# Patient Record
Sex: Female | Born: 1990 | Hispanic: No | Marital: Married | Smoking: Never smoker
Health system: Southern US, Community
[De-identification: ages and names within clinical notes are randomized; demographics above are authoritative.]

## PROBLEM LIST (undated history)

## (undated) ENCOUNTER — Inpatient Hospital Stay (HOSPITAL_COMMUNITY): Payer: Self-pay

## (undated) DIAGNOSIS — O219 Vomiting of pregnancy, unspecified: Secondary | ICD-10-CM

## (undated) DIAGNOSIS — K117 Disturbances of salivary secretion: Secondary | ICD-10-CM

## (undated) DIAGNOSIS — R000 Tachycardia, unspecified: Secondary | ICD-10-CM

## (undated) DIAGNOSIS — O99019 Anemia complicating pregnancy, unspecified trimester: Secondary | ICD-10-CM

## (undated) DIAGNOSIS — E876 Hypokalemia: Secondary | ICD-10-CM

## (undated) DIAGNOSIS — E0590 Thyrotoxicosis, unspecified without thyrotoxic crisis or storm: Secondary | ICD-10-CM

## (undated) DIAGNOSIS — D696 Thrombocytopenia, unspecified: Secondary | ICD-10-CM

## (undated) DIAGNOSIS — N90810 Female genital mutilation status, unspecified: Secondary | ICD-10-CM

## (undated) DIAGNOSIS — N939 Abnormal uterine and vaginal bleeding, unspecified: Secondary | ICD-10-CM

## (undated) DIAGNOSIS — K219 Gastro-esophageal reflux disease without esophagitis: Secondary | ICD-10-CM

## (undated) DIAGNOSIS — Z789 Other specified health status: Secondary | ICD-10-CM

## (undated) DIAGNOSIS — D649 Anemia, unspecified: Secondary | ICD-10-CM

## (undated) DIAGNOSIS — Z98891 History of uterine scar from previous surgery: Secondary | ICD-10-CM

---
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
## 2017-01-11 DIAGNOSIS — Z8639 Personal history of other endocrine, nutritional and metabolic disease: Secondary | ICD-10-CM

## 2017-01-11 HISTORY — DX: Personal history of other endocrine, nutritional and metabolic disease: Z86.39

## 2017-09-22 ENCOUNTER — Encounter (HOSPITAL_COMMUNITY): Payer: Self-pay

## 2017-09-22 ENCOUNTER — Emergency Department (HOSPITAL_COMMUNITY)
Admission: EM | Admit: 2017-09-22 | Discharge: 2017-09-22 | Disposition: A | Discharge status: Home / Self Care | Payer: Medicaid Other | Attending: Emergency Medicine | Admitting: Emergency Medicine

## 2017-09-22 ENCOUNTER — Other Ambulatory Visit: Payer: Self-pay

## 2017-09-22 DIAGNOSIS — O219 Vomiting of pregnancy, unspecified: Secondary | ICD-10-CM | POA: Diagnosis present

## 2017-09-22 DIAGNOSIS — R1013 Epigastric pain: Secondary | ICD-10-CM

## 2017-09-22 DIAGNOSIS — Z3201 Encounter for pregnancy test, result positive: Secondary | ICD-10-CM

## 2017-09-22 DIAGNOSIS — Z3A00 Weeks of gestation of pregnancy not specified: Secondary | ICD-10-CM | POA: Diagnosis not present

## 2017-09-22 LAB — COMPREHENSIVE METABOLIC PANEL
ALT: 14 U/L (ref 0–44)
AST: 22 U/L (ref 15–41)
Albumin: 4.5 g/dL (ref 3.5–5.0)
Alkaline Phosphatase: 55 U/L (ref 38–126)
Anion gap: 15 (ref 5–15)
BUN: 14 mg/dL (ref 6–20)
CHLORIDE: 106 mmol/L (ref 98–111)
CO2: 18 mmol/L — AB (ref 22–32)
CREATININE: 0.66 mg/dL (ref 0.44–1.00)
Calcium: 9.7 mg/dL (ref 8.9–10.3)
GFR calc non Af Amer: >60 mL/min (ref >60)
Glucose, Bld: 75 mg/dL (ref 70–99)
POTASSIUM: 3.5 mmol/L (ref 3.5–5.1)
Sodium: 139 mmol/L (ref 135–145)
Total Bilirubin: 1.3 mg/dL — ABNORMAL HIGH (ref 0.3–1.2)
Total Protein: 8.4 g/dL — ABNORMAL HIGH (ref 6.5–8.1)

## 2017-09-22 LAB — URINALYSIS, ROUTINE W REFLEX MICROSCOPIC
BACTERIA UA: NONE SEEN
BILIRUBIN URINE: NEGATIVE
Glucose, UA: NEGATIVE mg/dL
KETONES UR: 80 mg/dL — AB
LEUKOCYTES UA: NEGATIVE
Nitrite: NEGATIVE
PROTEIN: 30 mg/dL — AB
Specific Gravity, Urine: 1.033 — ABNORMAL HIGH (ref 1.005–1.030)
pH: 5 (ref 5.0–8.0)

## 2017-09-22 LAB — CBC
HEMATOCRIT: 42 % (ref 36.0–46.0)
Hemoglobin: 13.6 g/dL (ref 12.0–15.0)
MCH: 28.5 pg (ref 26.0–34.0)
MCHC: 32.4 g/dL (ref 30.0–36.0)
MCV: 88.1 fL (ref 78.0–100.0)
PLATELETS: 232 10*3/uL (ref 150–400)
RBC: 4.77 MIL/uL (ref 3.87–5.11)
RDW: 12.7 % (ref 11.5–15.5)
WBC: 10.3 10*3/uL (ref 4.0–10.5)

## 2017-09-22 LAB — I-STAT BETA HCG BLOOD, ED (MC, WL, AP ONLY)

## 2017-09-22 LAB — LIPASE, BLOOD: LIPASE: 25 U/L (ref 11–51)

## 2017-09-22 MED ORDER — ONDANSETRON HCL 4 MG/2ML IJ SOLN
4.0000 mg | Freq: Once | INTRAMUSCULAR | Status: AC
  Administered 2017-09-22: 4 mg via INTRAVENOUS
  Filled 2017-09-22: qty 2

## 2017-09-22 MED ORDER — CALCIUM CARBONATE ANTACID 500 MG PO CHEW
400.0000 mg | CHEWABLE_TABLET | Freq: Once | ORAL | Status: AC
  Administered 2017-09-22: 400 mg via ORAL
  Filled 2017-09-22: qty 2

## 2017-09-22 MED ORDER — SUCRALFATE 1 G PO TABS
1.0000 g | ORAL_TABLET | Freq: Once | ORAL | Status: AC
  Administered 2017-09-22: 1 g via ORAL
  Filled 2017-09-22: qty 1

## 2017-09-22 MED ORDER — SUCRALFATE 1 G PO TABS: 1.0000 g | ORAL_TABLET | Freq: Three times a day (TID) | ORAL | 0 refills | Status: DC

## 2017-09-22 MED ORDER — CALCIUM CARBONATE ANTACID 500 MG PO CHEW: 1.0000 | CHEWABLE_TABLET | Freq: Every day | ORAL | 0 refills | Status: DC

## 2017-09-22 MED ORDER — ONDANSETRON 4 MG PO TBDP: 4.0000 mg | ORAL_TABLET | Freq: Three times a day (TID) | ORAL | 0 refills | Status: DC | PRN

## 2017-09-22 MED ORDER — SODIUM CHLORIDE 0.9 % IV BOLUS
1000.0000 mL | Freq: Once | INTRAVENOUS | Status: AC
  Administered 2017-09-22: 1000 mL via INTRAVENOUS

## 2017-09-22 NOTE — ED Triage Notes (Signed)
Pt endorses abd pain with n/v x 1 week. Took a pregnancy test that was positive, came here to be sure that she is pregnant. VSS.

## 2017-09-22 NOTE — ED Provider Notes (Signed)
[facility]ERGENCY [facility] Provider Note   CSN: [id number] Arrival date & time: 09/22/17  1047     History   Chief Complaint Chief Complaint  Patient presents with  . Possible Pregnancy  . Vomiting  . Abdominal Pain    HPI Dorothy Haas F Feeback is a 1063P2 27 y.o. female presents today for evaluation of acute onset, progressively worsening feelings of indigestion, nausea, and vomiting for 1 week.  She notes intermittent epigastric pain which is burning, radiates up at times.  States this is consistent with indigestion she has experienced in the past.  She denies any shortness of breath or chest pain otherwise.  She notes that for the past 3 to 4 days she has not been able to keep down any food or drink.  Last menstrual period was on 08/01/2017.  Took a home pregnancy test recently that was positive.  Dates that she has had similar symptoms with her previous pregnancies.  Has not tried anything for her symptoms.  She denies fevers, chills, urinary symptoms, constipation, or diarrhea.  She denies vaginal itching, bleeding, discharge out of the ordinary.  He does feel lightheaded and will have occasional headaches which she states are typical for her when she feels dehydrated.  Denies syncope.  Primarily Arabic speaking, I was able to translate as it is my first language.  The history is provided by the patient and the spouse. The history is limited by a language barrier. Language interpreter used: Myself.    History reviewed. No pertinent past medical history.  There are no active problems to display for this patient.   History reviewed. No pertinent surgical history.   OB History   None      Home Medications    Prior to Admission medications   Medication Sig Start Date End Date Taking? Authorizing Provider  calcium carbonate (TUMS) 500 MG chewable tablet Chew 1 tablet (200 mg of elemental calcium total) by mouth daily. 09/22/17   Luevenia MaxinFawze, Emmett Bracknell A, PA-C    ondansetron (ZOFRAN ODT) 4 MG disintegrating tablet Take 1 tablet (4 mg total) by mouth every 8 (eight) hours as needed for nausea or vomiting. 09/22/17   Luevenia MaxinFawze, Kerim Statzer A, PA-C  sucralfate (CARAFATE) 1 g tablet Take 1 tablet (1 g total) by mouth 4 (four) times daily -  with meals and at bedtime. 09/22/17   Jeanie SewerFawze, Karlina Suares A, PA-C    Family History History reviewed. No pertinent family history.  Social History Social History   Tobacco Use  . Smoking status: Never Smoker  Substance Use Topics  . Alcohol use: Never    Frequency: Never  . Drug use: Never     Allergies   Patient has no known allergies.   Review of Systems Review of Systems  Constitutional: Positive for fatigue. Negative for chills and fever.  Respiratory: Negative for shortness of breath.   Cardiovascular: Negative for chest pain.  Gastrointestinal: Positive for abdominal pain, nausea and vomiting. Negative for constipation and diarrhea.  Genitourinary: Negative for decreased urine volume, vaginal bleeding, vaginal discharge and vaginal pain.  All other systems reviewed and are negative.    Physical Exam Updated Vital Signs BP 118/85 (BP Location: Right Arm)   Pulse 91   Temp 98.9 F (37.2 C) (Oral)   Resp 16   Ht 5\' 2"  (1.575 m)   Wt 50 kg   LMP 08/01/2017 (Exact Date)   SpO2 100%   BMI 20.16 kg/m   Physical Exam  Constitutional:  She appears well-developed and well-nourished. No distress.  HENT:  Head: Normocephalic and atraumatic.  Eyes: Conjunctivae are normal. Right eye exhibits no discharge. Left eye exhibits no discharge.  Neck: No JVD present. No tracheal deviation present.  Cardiovascular: Normal rate, regular rhythm and normal heart sounds.  Pulmonary/Chest: Effort normal and breath sounds normal.  Abdominal: Soft. Bowel sounds are normal. She exhibits no distension. There is tenderness in the epigastric area. There is no rigidity, no rebound, no guarding, no CVA tenderness, no tenderness at  McBurney's point and negative Murphy's sign.  Musculoskeletal: She exhibits no edema.  No midline spine TTP, no paraspinal muscle tenderness, no deformity, crepitus, or step-off noted   Neurological: She is alert.  Skin: Skin is warm and dry. No erythema.  Psychiatric: She has a normal mood and affect. Her behavior is normal.  Nursing note and vitals reviewed.    ED Treatments / Results  Labs (all labs ordered are listed, but only abnormal results are displayed) Labs Reviewed  COMPREHENSIVE METABOLIC PANEL - Abnormal; Notable for the following components:      Result Value   CO2 18 (*)    Total Protein 8.4 (*)    Total Bilirubin 1.3 (*)    All other components within normal limits  URINALYSIS, ROUTINE W REFLEX MICROSCOPIC - Abnormal; Notable for the following components:   APPearance HAZY (*)    Specific Gravity, Urine 1.033 (*)    Hgb urine dipstick SMALL (*)    Ketones, ur 80 (*)    Protein, ur 30 (*)    All other components within normal limits  I-STAT BETA HCG BLOOD, ED ([facility], [facility], [facility] ONLY) - Abnormal; Notable for the following components:   I-stat hCG, quantitative >2,000.0 (*)    All other components within normal limits  LIPASE, BLOOD  CBC    EKG None  Radiology No results found.  Procedures Procedures (including critical care time)  Medications Ordered in ED Medications  calcium carbonate (TUMS - dosed in mg elemental calcium) chewable tablet 400 mg of elemental calcium (has no administration in time range)  sodium chloride 0.9 % bolus 1,000 mL (1,000 mLs Intravenous New Bag/Given 09/22/17 1759)  ondansetron (ZOFRAN) injection 4 mg (4 mg Intravenous Given 09/22/17 1800)  sucralfate (CARAFATE) tablet 1 g (1 g Oral Given 09/22/17 1800)     Initial Impression / Assessment and Plan / ED Course  I have reviewed the triage vital signs and the nursing notes.  Pertinent labs & imaging results that were available during my care of the patient were reviewed by me and  considered in my medical decision making (see chart for details).     Patient presents with 3 to 4-day history of nausea and vomiting with intermittent epigastric abdominal pain.  She is afebrile, vital signs are stable.  She is nontoxic in appearance.  Abdomen is soft with no rebound or guarding.  Has had similar symptoms with previous pregnancies.  Pregnancy test today is positive.  No lower abdominal tenderness to suggest ectopic pregnancy, ovarian torsion, TOA, or PID.  Doubt obstruction, perforation, appendicitis, colitis.  Suspect hyperemesis gravidarum versus GERD in pregnancy.  Lab work reviewed by me shows no leukocytosis, no anemia.  No metabolic derangements.  Anion gap is within normal limits.  Lipase and creatinine within normal limits.  UA is not suggestive of UTI or nephrolithiasis but is not suggestive of dehydration with increased specific gravity and mild ketones and protein.  She was given IV fluids, Carafate, Tums,  and Zofran and on reevaluation states she is feeling much better.  She is tolerating p.o. fluids in the ED without difficulty.  Discussed lifestyle modifications.  Recommend follow-up with OB/GYN for reevaluation and for confirmatory ultrasound.  Discussed strict ED return precautions.  Patient and patient's husband verbalized understanding of and agreement with plan and patient is stable for discharge home at this time. Final Clinical Impressions(s) / ED Diagnoses   Final diagnoses:  Nausea and vomiting in pregnancy  Epigastric pain    ED Discharge Orders         Ordered    sucralfate (CARAFATE) 1 g tablet  3 times daily with meals & bedtime     09/22/17 1920    ondansetron (ZOFRAN ODT) 4 MG disintegrating tablet  Every 8 hours PRN     09/22/17 1920    calcium carbonate (TUMS) 500 MG chewable tablet  Daily     09/22/17 1920           Jeanie Sewer, PA-C 09/22/17 1931    Benjiman Core, MD 09/23/17 0101

## 2017-09-22 NOTE — Discharge Instructions (Addendum)
Your pregnancy test today was positive.  You can take Tums as needed 2-3 times daily.  Take Carafate 3 times daily with meals.  Take Zofran as needed for nausea up to 3 times daily.  Drink plenty of water and get plenty of rest.  Eat small frequent meals throughout the day.  Avoid spicy foods, fried foods, or fatty foods.  Elevate the head of the bed.  Follow-up with OB/GYN for reevaluation of symptoms.  Return to the emergency department if any concerning signs or symptoms develop such as fevers, vaginal bleeding, worsening abdominal pain, or persistent vomiting.

## 2017-09-22 NOTE — ED Notes (Signed)
Pt is here for evaluation of feeling fatigued and  Having indigestion.  LMP 7/22, positive home pregnancy test.  No abdominal pain. No acute distress.  Pt appears to be feeling tired.

## 2017-09-22 NOTE — ED Notes (Signed)
Pt states she is feeling well but declined to accept crackers or drink other than water.

## 2017-09-26 ENCOUNTER — Emergency Department (HOSPITAL_COMMUNITY)
Admission: EM | Admit: 2017-09-26 | Discharge: 2017-09-27 | Disposition: A | Discharge status: Home / Self Care | Payer: Medicaid Other | Attending: Emergency Medicine | Admitting: Emergency Medicine

## 2017-09-26 ENCOUNTER — Encounter (HOSPITAL_COMMUNITY): Payer: Self-pay

## 2017-09-26 ENCOUNTER — Other Ambulatory Visit: Payer: Self-pay

## 2017-09-26 DIAGNOSIS — K920 Hematemesis: Secondary | ICD-10-CM | POA: Diagnosis not present

## 2017-09-26 DIAGNOSIS — Z3A13 13 weeks gestation of pregnancy: Secondary | ICD-10-CM | POA: Diagnosis not present

## 2017-09-26 DIAGNOSIS — E876 Hypokalemia: Secondary | ICD-10-CM | POA: Insufficient documentation

## 2017-09-26 DIAGNOSIS — R102 Pelvic and perineal pain: Secondary | ICD-10-CM

## 2017-09-26 DIAGNOSIS — Z79899 Other long term (current) drug therapy: Secondary | ICD-10-CM | POA: Diagnosis not present

## 2017-09-26 DIAGNOSIS — O210 Mild hyperemesis gravidarum: Secondary | ICD-10-CM | POA: Insufficient documentation

## 2017-09-26 DIAGNOSIS — O219 Vomiting of pregnancy, unspecified: Secondary | ICD-10-CM | POA: Diagnosis present

## 2017-09-26 LAB — BASIC METABOLIC PANEL
ANION GAP: 17 — AB (ref 5–15)
BUN: 11 mg/dL (ref 6–20)
CHLORIDE: 100 mmol/L (ref 98–111)
CO2: 20 mmol/L — ABNORMAL LOW (ref 22–32)
Calcium: 9.6 mg/dL (ref 8.9–10.3)
Creatinine, Ser: 0.77 mg/dL (ref 0.44–1.00)
GFR calc Af Amer: >60 mL/min (ref >60)
GFR calc non Af Amer: >60 mL/min (ref >60)
Glucose, Bld: 95 mg/dL (ref 70–99)
POTASSIUM: 2.7 mmol/L — AB (ref 3.5–5.1)
Sodium: 137 mmol/L (ref 135–145)

## 2017-09-26 LAB — I-STAT TROPONIN, ED: Troponin i, poc: 0 ng/mL (ref 0.00–0.08)

## 2017-09-26 LAB — CBC
HEMATOCRIT: 41.8 % (ref 36.0–46.0)
HEMOGLOBIN: 14.3 g/dL (ref 12.0–15.0)
MCH: 28.8 pg (ref 26.0–34.0)
MCHC: 34.2 g/dL (ref 30.0–36.0)
MCV: 84.1 fL (ref 78.0–100.0)
Platelets: ABNORMAL 10*3/uL (ref 150–400)
RBC: 4.97 MIL/uL (ref 3.87–5.11)
RDW: 12.1 % (ref 11.5–15.5)
WBC: 10.5 10*3/uL (ref 4.0–10.5)

## 2017-09-26 NOTE — ED Triage Notes (Addendum)
Patient c/o vomiting blood. States unable to hold anything down. Also endorsed being [redacted] weeks pregnant. Also c/o CP

## 2017-09-26 NOTE — ED Notes (Signed)
Critical Potassium of 2.7

## 2017-09-27 ENCOUNTER — Emergency Department (HOSPITAL_COMMUNITY): Payer: Medicaid Other

## 2017-09-27 LAB — HCG, QUANTITATIVE, PREGNANCY: hCG, Beta Chain, Quant, S: 248645 m[IU]/mL — ABNORMAL HIGH (ref <5)

## 2017-09-27 MED ORDER — FAMOTIDINE 20 MG PO TABS: 20.0000 mg | ORAL_TABLET | Freq: Two times a day (BID) | ORAL | 0 refills | Status: DC

## 2017-09-27 MED ORDER — METOCLOPRAMIDE HCL 5 MG/ML IJ SOLN
10.0000 mg | Freq: Once | INTRAMUSCULAR | Status: AC
  Administered 2017-09-27: 10 mg via INTRAVENOUS
  Filled 2017-09-27: qty 2

## 2017-09-27 MED ORDER — VITAMIN B-6 25 MG PO TABS: 25.0000 mg | ORAL_TABLET | Freq: Three times a day (TID) | ORAL | 0 refills | Status: DC | PRN

## 2017-09-27 MED ORDER — FAMOTIDINE IN NACL 20-0.9 MG/50ML-% IV SOLN
20.0000 mg | Freq: Once | INTRAVENOUS | Status: AC
  Administered 2017-09-27: 20 mg via INTRAVENOUS
  Filled 2017-09-27: qty 50

## 2017-09-27 MED ORDER — MAGNESIUM SULFATE 2 GM/50ML IV SOLN
2.0000 g | Freq: Once | INTRAVENOUS | Status: AC
  Administered 2017-09-27: 2 g via INTRAVENOUS
  Filled 2017-09-27: qty 50

## 2017-09-27 MED ORDER — SODIUM CHLORIDE 0.9 % IV BOLUS (SEPSIS)
1000.0000 mL | Freq: Once | INTRAVENOUS | Status: AC
  Administered 2017-09-27: 1000 mL via INTRAVENOUS

## 2017-09-27 MED ORDER — DOXYLAMINE SUCCINATE (SLEEP) 25 MG PO TABS: 12.5000 mg | ORAL_TABLET | Freq: Every evening | ORAL | 0 refills | Status: DC | PRN

## 2017-09-27 MED ORDER — VITAMIN B-6 25 MG PO TABS: 25.0000 mg | ORAL_TABLET | Freq: Every day | ORAL | Status: DC

## 2017-09-27 MED ORDER — POTASSIUM CHLORIDE CRYS ER 20 MEQ PO TBCR
40.0000 meq | EXTENDED_RELEASE_TABLET | Freq: Once | ORAL | Status: AC
  Administered 2017-09-27: 40 meq via ORAL
  Filled 2017-09-27: qty 2

## 2017-09-27 MED ORDER — DOXYLAMINE SUCCINATE (SLEEP) 25 MG PO TABS: 25.0000 mg | ORAL_TABLET | Freq: Once | ORAL | Status: DC

## 2017-09-27 MED ORDER — DEXTROSE IN LACTATED RINGERS 5 % IV SOLN
INTRAVENOUS | Status: DC
  Administered 2017-09-27: 10:00:00 via INTRAVENOUS

## 2017-09-27 MED ORDER — DOXYLAMINE-PYRIDOXINE 10-10 MG PO TBEC: 2.0000 | DELAYED_RELEASE_TABLET | Freq: Every day | ORAL | 0 refills | Status: DC

## 2017-09-27 MED ORDER — POTASSIUM CHLORIDE CRYS ER 20 MEQ PO TBCR: 20.0000 meq | EXTENDED_RELEASE_TABLET | Freq: Once | ORAL | 0 refills | Status: DC

## 2017-09-27 MED ORDER — POTASSIUM CHLORIDE 10 MEQ/100ML IV SOLN
10.0000 meq | Freq: Once | INTRAVENOUS | Status: AC
  Administered 2017-09-27: 10 meq via INTRAVENOUS
  Filled 2017-09-27: qty 100

## 2017-09-27 NOTE — ED Notes (Signed)
Patient given water and saltine/graham crackers. Tolerating well at this time.

## 2017-09-27 NOTE — ED Provider Notes (Signed)
Patient presents with pregnancy and frequent vomiting.  Dr. Bebe ShaggyWickline initiated treatment.  Patient was found to be hypokalemic.  Plan was for rehydration, electrolyte substitution and ultrasound. Physical Exam  BP 111/74   Pulse 87   Temp 98.8 F (37.1 C) (Oral)   Resp 17   LMP 08/01/2017 (Exact Date)   SpO2 100%   Physical Exam  Constitutional: She is oriented to person, place, and time. She appears well-developed and well-nourished.  Patient is fatigued but alert and appropriate.  No respiratory distress.  Clinically well in appearance.  Eyes: EOM are normal.  Pulmonary/Chest: Effort normal.  Abdominal: Soft. She exhibits no distension and no mass. There is no tenderness. There is no guarding.  Musculoskeletal: Normal range of motion.  Neurological: She is alert and oriented to person, place, and time. Coordination normal.  Skin: Skin is warm and dry.  Psychiatric: She has a normal mood and affect.    ED Course/Procedures     Procedures  MDM  After initiation of treatment by Dr. Bebe ShaggyWickline, patient was feeling improvement.  Abdominal pain was subsiding.  She did not have further vomiting.  Ultrasound confirms IUP without apparent complications.  Patient is pain-free in the abdomen at this time and does not need further imaging.  We have transition to supplemental oral intake of potassium, doxylamine and pyridoxine.  I have also had the patient get IV Pepcid and lactated Ringer's with D5.  Prior to assuming care, patient had Reglan, 10 mEq potassium IV, magnesium sulfate and a liter of normal saline.  Patient continued to show signs of improvement and did not have ongoing vomiting.  With abdominal pain resolved, ultrasound within normal limits, will plan for continued home care with close follow-up.  Patient is being discharged with prescriptions for Pepcid to take twice daily for the next week, generic Diclygis and potassium supplement 20 mEq a day.  Return precautions are  reviewed.       Arby BarrettePfeiffer, Heinz Eckert, MD 09/27/17 1214

## 2017-09-27 NOTE — ED Notes (Signed)
Patient transported to Ultrasound 

## 2017-09-27 NOTE — ED Provider Notes (Signed)
[facility] EMERGENCY DEPARTMENT Provider Note   CSN: [id number] Arrival date & time: 09/26/17  2135     History   Chief Complaint Chief Complaint  Patient presents with  . Hematemesis    HPI Dorothy Haas is a 27 y.o. female.  The history is provided by the patient and the spouse. A language interpreter was used ([id number]).  Emesis   This is a recurrent problem. The current episode started more than 1 week ago. The problem has been gradually worsening. The emesis has an appearance of stomach contents. There has been no fever. Associated symptoms include abdominal pain. Pertinent negatives include no diarrhea.  Patient with known early pregnancy presents with vomiting.  She reports she has been vomiting for up to 2 weeks, and is worsening.  She now has some blood mixed in with the vomitus.  Denies any blood in her stool.  She also is reporting upper abdominal pain.  No fevers. Patient was seen on September 12 and was confirmed to have pregnancy, so noted to have vomiting at that time.  Since then she has had persistent vomiting and feels weak. Tried home medications without improvement Reports there has been some pain in her chest with vomiting  PMH-none OB History    Gravida  1   Para      Term      Preterm      AB      Living        SAB      TAB      Ectopic      Multiple      Live Births               Home Medications    Prior to Admission medications   Medication Sig Start Date End Date Taking? Authorizing Provider  calcium carbonate (TUMS) 500 MG chewable tablet Chew 1 tablet (200 mg of elemental calcium total) by mouth daily. 09/22/17   Luevenia Maxin, Mina A, PA-C  ondansetron (ZOFRAN ODT) 4 MG disintegrating tablet Take 1 tablet (4 mg total) by mouth every 8 (eight) hours as needed for nausea or vomiting. 09/22/17   Luevenia Maxin, Mina A, PA-C  sucralfate (CARAFATE) 1 g tablet Take 1 tablet (1 g total) by mouth 4 (four) times daily -   with meals and at bedtime. 09/22/17   Jeanie Sewer, PA-C    Family History No family history on file.  Social History Social History   Tobacco Use  . Smoking status: Never Smoker  Substance Use Topics  . Alcohol use: Never    Frequency: Never  . Drug use: Never     Allergies   Patient has no known allergies.   Review of Systems Review of Systems  Gastrointestinal: Positive for abdominal pain and vomiting. Negative for blood in stool and diarrhea.  Genitourinary: Negative for vaginal bleeding.  All other systems reviewed and are negative.    Physical Exam Updated Vital Signs BP 119/83   Pulse 88   Temp 98.8 F (37.1 C) (Oral)   Resp 16   LMP 08/01/2017 (Exact Date)   SpO2 100%   Physical Exam CONSTITUTIONAL: Well developed/well nourished, uncomfortable appearing HEAD: Normocephalic/atraumatic EYES: EOMI/PERRL, no icterus, conjunctival pink ENMT: Mucous membranes dry NECK: supple no meningeal signs SPINE/BACK:entire spine nontender CV: S1/S2 noted, no murmurs/rubs/gallops noted LUNGS: Lungs are clear to auscultation bilaterally, no apparent distress ABDOMEN: soft, nontender, no rebound or guarding, bowel sounds noted throughout abdomen GU:no cva  tenderness NEURO: Pt is awake/alert/appropriate, moves all extremitiesx4.  No facial droop.   EXTREMITIES: pulses normal/equal, full ROM SKIN: warm, color normal PSYCH: no abnormalities of mood noted, alert and oriented to situation   ED Treatments / Results  Labs (all labs ordered are listed, but only abnormal results are displayed) Labs Reviewed  BASIC METABOLIC PANEL - Abnormal; Notable for the following components:      Result Value   Potassium 2.7 (*)    CO2 20 (*)    Anion gap 17 (*)    All other components within normal limits  CBC  HCG, QUANTITATIVE, PREGNANCY  I-STAT TROPONIN, ED    EKG EKG Interpretation  Date/Time:  Monday September 26 2017 22:10:07 EDT Ventricular Rate:  100 PR  Interval:  130 QRS Duration: 74 QT Interval:  324 QTC Calculation: 417 R Axis:   65 Text Interpretation:  Normal sinus rhythm No previous ECGs available Interpretation limited secondary to artifact Confirmed by Zadie RhineWickline, Richardine Peppers ([id number]) on 09/27/2017 5:13:11 AM   Radiology No results found.  Procedures Procedures    Medications Ordered in ED Medications  potassium chloride 10 mEq in 100 mL IVPB (10 mEq Intravenous New Bag/Given 09/27/17 0624)  sodium chloride 0.9 % bolus 1,000 mL (0 mLs Intravenous Stopped 09/27/17 0652)  metoCLOPramide (REGLAN) injection 10 mg (10 mg Intravenous Given 09/27/17 [id number])  magnesium sulfate IVPB 2 g 50 mL (0 g Intravenous Stopped 09/27/17 [id number])     Initial Impression / Assessment and Plan / ED Course  I have reviewed the triage vital signs and the nursing notes.  Pertinent labs  results that were available during my care of the patient were reviewed by me and considered in my medical decision making (see chart for details).     7:08 AM Patient with early pregnancy presenting with probable hyperemesis gravidarum.  She did endorse some blood in her vomit, but suspect this is likely minimal or possible self limited Mallory-Weiss tear.  No signs of acute GI bleed, denies bloody stool, and hemoglobin is stable. She appears dehydrated, will require IV fluids.  She is also hypokalemic and  probable hypomagnesemia, IV K and magnesium ordered  Signed out to Dr. Clarice PolePfeifer with hCG quant pending.  Depending on quant level, patient may need pelvic ultrasound imaging.  I have went ahead and sent recommended nausea medications to her pharmacy. If pt is unable to tolerated PO she may need to be admitted  Final Clinical Impressions(s) / ED Diagnoses   Final diagnoses:  Hyperemesis gravidarum    ED Discharge Orders         Ordered    pyridOXINE (PYRIDOXINE) 25 MG tablet  Every 8 hours PRN     09/27/17 0706    doxylamine, Sleep, (UNISOM) 25 MG tablet  At bedtime PRN      09/27/17 0706           Zadie RhineWickline, Ulyses Panico, MD 09/27/17 [phone]

## 2017-09-27 NOTE — Discharge Instructions (Signed)
1.  Start taking Pepcid twice a day for the next 1 to 2 weeks.  Start doxylamine\Pyridoxine for nausea and vomiting as prescribed.  Start taking potassium supplement as prescribed.  Try to eat foods rich in potassium. 2.  Try to stay hydrated with frequent sips of fluids. 3.  Follow-up with your obstetrician within the next 1 to 3 days. 4.  Return immediately to the emergency department if you are feeling pain, you continue to have frequent vomiting, you feel generally weak or other concerning symptoms.

## 2017-09-27 NOTE — ED Notes (Signed)
Patient verbalizes understanding of discharge instructions. Opportunity for questioning and answers were provided. Armband removed by staff, pt discharged from ED.  

## 2017-10-07 ENCOUNTER — Emergency Department (HOSPITAL_COMMUNITY)
Admission: EM | Admit: 2017-10-07 | Discharge: 2017-10-07 | Disposition: A | Discharge status: Home / Self Care | Payer: Medicaid Other | Attending: Emergency Medicine | Admitting: Emergency Medicine

## 2017-10-07 ENCOUNTER — Encounter (HOSPITAL_COMMUNITY): Payer: Self-pay | Admitting: Emergency Medicine

## 2017-10-07 DIAGNOSIS — Z3A09 9 weeks gestation of pregnancy: Secondary | ICD-10-CM | POA: Insufficient documentation

## 2017-10-07 DIAGNOSIS — E860 Dehydration: Secondary | ICD-10-CM | POA: Diagnosis present

## 2017-10-07 DIAGNOSIS — O211 Hyperemesis gravidarum with metabolic disturbance: Secondary | ICD-10-CM | POA: Insufficient documentation

## 2017-10-07 DIAGNOSIS — O210 Mild hyperemesis gravidarum: Secondary | ICD-10-CM

## 2017-10-07 LAB — CBC WITH DIFFERENTIAL/PLATELET
Abs Immature Granulocytes: 0 10*3/uL (ref 0.0–0.1)
Basophils Absolute: 0 10*3/uL (ref 0.0–0.1)
Basophils Relative: 0 %
EOS ABS: 0 10*3/uL (ref 0.0–0.7)
EOS PCT: 0 %
HEMATOCRIT: 40.4 % (ref 36.0–46.0)
HEMOGLOBIN: 13.6 g/dL (ref 12.0–15.0)
Immature Granulocytes: 0 %
LYMPHS ABS: 1.6 10*3/uL (ref 0.7–4.0)
LYMPHS PCT: 21 %
MCH: 28.4 pg (ref 26.0–34.0)
MCHC: 33.7 g/dL (ref 30.0–36.0)
MCV: 84.3 fL (ref 78.0–100.0)
MONO ABS: 0.7 10*3/uL (ref 0.1–1.0)
MONOS PCT: 9 %
Neutro Abs: 5.3 10*3/uL (ref 1.7–7.7)
Neutrophils Relative %: 70 %
Platelets: 218 10*3/uL (ref 150–400)
RBC: 4.79 MIL/uL (ref 3.87–5.11)
RDW: 12.1 % (ref 11.5–15.5)
WBC: 7.7 10*3/uL (ref 4.0–10.5)

## 2017-10-07 LAB — URINALYSIS, ROUTINE W REFLEX MICROSCOPIC
Bilirubin Urine: NEGATIVE
GLUCOSE, UA: NEGATIVE mg/dL
HGB URINE DIPSTICK: NEGATIVE
KETONES UR: 80 mg/dL — AB
Leukocytes, UA: NEGATIVE
Nitrite: NEGATIVE
Protein, ur: 100 mg/dL — AB
Specific Gravity, Urine: 1.027 (ref 1.005–1.030)
pH: 5 (ref 5.0–8.0)

## 2017-10-07 LAB — BASIC METABOLIC PANEL
Anion gap: 18 — ABNORMAL HIGH (ref 5–15)
BUN: 10 mg/dL (ref 6–20)
CHLORIDE: 100 mmol/L (ref 98–111)
CO2: 18 mmol/L — AB (ref 22–32)
CREATININE: 0.7 mg/dL (ref 0.44–1.00)
Calcium: 9.8 mg/dL (ref 8.9–10.3)
GFR calc Af Amer: >60 mL/min (ref >60)
GFR calc non Af Amer: >60 mL/min (ref >60)
GLUCOSE: 120 mg/dL — AB (ref 70–99)
Potassium: 3.1 mmol/L — ABNORMAL LOW (ref 3.5–5.1)
Sodium: 136 mmol/L (ref 135–145)

## 2017-10-07 LAB — I-STAT CG4 LACTIC ACID, ED: LACTIC ACID, VENOUS: 0.89 mmol/L (ref 0.5–1.9)

## 2017-10-07 MED ORDER — POTASSIUM CHLORIDE 10 MEQ/100ML IV SOLN
10.0000 meq | Freq: Once | INTRAVENOUS | Status: AC
  Administered 2017-10-07: 10 meq via INTRAVENOUS
  Filled 2017-10-07: qty 100

## 2017-10-07 MED ORDER — METOCLOPRAMIDE HCL 5 MG/ML IJ SOLN
10.0000 mg | Freq: Once | INTRAMUSCULAR | Status: AC
  Administered 2017-10-07: 10 mg via INTRAVENOUS
  Filled 2017-10-07: qty 2

## 2017-10-07 MED ORDER — FAMOTIDINE IN NACL 20-0.9 MG/50ML-% IV SOLN
20.0000 mg | Freq: Once | INTRAVENOUS | Status: AC
  Administered 2017-10-07: 20 mg via INTRAVENOUS
  Filled 2017-10-07: qty 50

## 2017-10-07 MED ORDER — POTASSIUM CHLORIDE CRYS ER 20 MEQ PO TBCR
30.0000 meq | EXTENDED_RELEASE_TABLET | Freq: Once | ORAL | Status: AC
  Administered 2017-10-07: 30 meq via ORAL
  Filled 2017-10-07: qty 1

## 2017-10-07 MED ORDER — SODIUM CHLORIDE 0.9 % IV BOLUS
500.0000 mL | Freq: Once | INTRAVENOUS | Status: AC
  Administered 2017-10-07: 500 mL via INTRAVENOUS

## 2017-10-07 MED ORDER — METOCLOPRAMIDE HCL 10 MG PO TABS: 10.0000 mg | ORAL_TABLET | Freq: Every day | ORAL | 0 refills | Status: DC

## 2017-10-07 MED ORDER — SODIUM CHLORIDE 0.9 % IV BOLUS
1000.0000 mL | Freq: Once | INTRAVENOUS | Status: AC
  Administered 2017-10-07: 1000 mL via INTRAVENOUS

## 2017-10-07 MED ORDER — LACTATED RINGERS IV BOLUS
1000.0000 mL | Freq: Once | INTRAVENOUS | Status: AC
  Administered 2017-10-07: 1000 mL via INTRAVENOUS

## 2017-10-07 NOTE — ED Provider Notes (Signed)
[facility] EMERGENCY DEPARTMENT Provider Note   CSN: [id number] Arrival date & time: 10/07/17  0128     History   Chief Complaint Chief Complaint  Patient presents with  . Hyperemesis Gravidarum    HPI Dorothy Haas is a 27 y.o. female.  HPI   Dorothy Haas is a 27yo female who is [redacted] weeks pregnant and has a history of hyperemesis gravidarum who presents to the emergency department for evaluation of vomiting, dehydration and epigastric pain.  She reports that she has had greater than 10 episodes of vomiting daily for the last 3 weeks.  Has a history of this with prior pregnancies.  She occasionally tastes blood and sometimes has very dark vomiting, denies coffee-ground emesis.  She also reports epigastric pain which is 6/10 in severity, constant and acutely worsened with episodes of vomiting.  She has been taking diclegis and potassium daily for her symptoms without relief.  Per chart review she had an ultrasound in the ED 9/17 which revealed IUP.  Patient denies any lower abdominal pain, vaginal bleeding or vaginal discharge.  She denies any fever, diarrhea, dysuria, chest pain, shortness of breath, leg swelling.  Feels generally weak and reports that she has not had much to eat or drink in several days.  History reviewed. No pertinent past medical history.  There are no active problems to display for this patient.   History reviewed. No pertinent surgical history.   OB History    Gravida  1   Para      Term      Preterm      AB      Living        SAB      TAB      Ectopic      Multiple      Live Births               Home Medications    Prior to Admission medications   Medication Sig Start Date End Date Taking? Authorizing Provider  calcium carbonate (TUMS) 500 MG chewable tablet Chew 1 tablet (200 mg of elemental calcium total) by mouth daily. Patient not taking: Reported on 10/07/2017 09/22/17   Michela Pitcher A, PA-C    doxylamine, Sleep, (UNISOM) 25 MG tablet Take 0.5 tablets (12.5 mg total) by mouth at bedtime as needed (nausea). Patient not taking: Reported on 10/07/2017 09/27/17   Zadie Rhine, MD  Doxylamine-Pyridoxine 10-10 MG TBEC Take 2 tablets by mouth at bedtime. Take 2 tablets at bedtime for the first 2 days.  If you still have nausea or vomiting, start taking 1 tablet in the morning and continue 2 tablets in the evening.  If you still feel nausea, on day 4 start taking 1 tablet in the morning, 1 tablet in the afternoon and 2 tablets at bedtime. Patient not taking: Reported on 10/07/2017 09/27/17   Arby Barrette, MD  famotidine (PEPCID) 20 MG tablet Take 1 tablet (20 mg total) by mouth 2 (two) times daily. Patient not taking: Reported on 10/07/2017 09/27/17   Arby Barrette, MD  ondansetron (ZOFRAN ODT) 4 MG disintegrating tablet Take 1 tablet (4 mg total) by mouth every 8 (eight) hours as needed for nausea or vomiting. Patient not taking: Reported on 10/07/2017 09/22/17   Michela Pitcher A, PA-C  potassium chloride SA (K-DUR,KLOR-CON) 20 MEQ tablet Take 1 tablet (20 mEq total) by mouth once for 1 dose. 09/27/17 09/27/17  Arby Barrette, MD  pyridOXINE (PYRIDOXINE)  25 MG tablet Take 1 tablet (25 mg total) by mouth every 8 (eight) hours as needed. Patient not taking: Reported on 10/07/2017 09/27/17   Zadie Rhine, MD  sucralfate (CARAFATE) 1 g tablet Take 1 tablet (1 g total) by mouth 4 (four) times daily -  with meals and at bedtime. Patient not taking: Reported on 10/07/2017 09/22/17   Jeanie Sewer, PA-C    Family History No family history on file.  Social History Social History   Tobacco Use  . Smoking status: Never Smoker  . Smokeless tobacco: Never Used  Substance Use Topics  . Alcohol use: Never    Frequency: Never  . Drug use: Never     Allergies   Patient has no known allergies.   Review of Systems Review of Systems  Constitutional: Negative for chills and fever.  HENT: Negative  for sore throat.   Respiratory: Negative for shortness of breath.   Cardiovascular: Negative for chest pain and leg swelling.  Gastrointestinal: Positive for abdominal pain, nausea and vomiting. Negative for blood in stool and diarrhea.  Genitourinary: Negative for difficulty urinating.  Musculoskeletal: Negative for back pain.  Skin: Negative for color change.  Neurological: Positive for weakness (generalized). Negative for headaches.  Psychiatric/Behavioral: Negative for agitation.  All other systems reviewed and are negative.    Physical Exam Updated Vital Signs BP 109/66   Pulse 90   Temp 99.7 F (37.6 C) (Oral)   Resp 18   LMP 08/01/2017 (Exact Date)   SpO2 100%   Physical Exam  Constitutional: She is oriented to person, place, and time. She appears well-developed and well-nourished. No distress.  She is in no apparent distress, non-toxic appearing. Appears fatigued.    HENT:  Head: Normocephalic and atraumatic.  Mucous membranes appear dry.  Eyes: Conjunctivae are normal. Right eye exhibits no discharge. Left eye exhibits no discharge.  Neck: Normal range of motion. Neck supple.  Cardiovascular:  No murmur heard. Tachycardic, regular rhythm.  Pulmonary/Chest: Effort normal and breath sounds normal. No stridor. No respiratory distress. She has no wheezes. She has no rales.  Abdominal:  Abdomen soft and nondistended.  Bowel sounds hypoactive.  Abdomen nontender to palpation.  Musculoskeletal: Normal range of motion.  No leg swelling.  Neurological: She is alert and oriented to person, place, and time. Coordination normal.  Skin: Skin is warm and dry. She is not diaphoretic.  Psychiatric: She has a normal mood and affect. Her behavior is normal.  Nursing note and vitals reviewed.   ED Treatments / Results  Labs (all labs ordered are listed, but only abnormal results are displayed) Labs Reviewed  BASIC METABOLIC PANEL - Abnormal; Notable for the following  components:      Result Value   Potassium 3.1 (*)    CO2 18 (*)    Glucose, Bld 120 (*)    Anion gap 18 (*)    All other components within normal limits  URINALYSIS, ROUTINE W REFLEX MICROSCOPIC - Abnormal; Notable for the following components:   Color, Urine AMBER (*)    APPearance CLOUDY (*)    Ketones, ur 80 (*)    Protein, ur 100 (*)    Bacteria, UA MANY (*)    All other components within normal limits  CBC WITH DIFFERENTIAL/PLATELET  I-STAT CG4 LACTIC ACID, ED  I-STAT CG4 LACTIC ACID, ED    EKG None  Radiology No results found.  Procedures Procedures (including critical care time)  Medications Ordered in ED Medications  lactated  ringers bolus 1,000 mL (0 mLs Intravenous Stopped 10/07/17 0705)  potassium chloride 10 mEq in 100 mL IVPB (0 mEq Intravenous Stopped 10/07/17 0748)  metoCLOPramide (REGLAN) injection 10 mg (10 mg Intravenous Given 10/07/17 0634)  sodium chloride 0.9 % bolus 1,000 mL (0 mLs Intravenous Stopped 10/07/17 0830)  famotidine (PEPCID) IVPB 20 mg premix (0 mg Intravenous Stopped 10/07/17 0824)  potassium chloride (K-DUR,KLOR-CON) CR tablet 30 mEq (30 mEq Oral Given 10/07/17 1016)  sodium chloride 0.9 % bolus 500 mL (500 mLs Intravenous New Bag/Given 10/07/17 1016)     Initial Impression / Assessment and Plan / ED Course  I have reviewed the triage vital signs and the nursing notes.  Pertinent labs & imaging results that were available during my care of the patient were reviewed by me and considered in my medical decision making (see chart for details).    Presents with hyperemesis gravidarum with associated abdominal pain with vomiting.  This been ongoing for 3 weeks and she has had several ER visits for the same.  She had a confirmed IUP 10 days ago.  Abdominal exam benign, nontender to palpation.  She reports tasting blood with the vomit.  I doubt upper GI bleed given hemoglobin normal, vital signs stable and no bloody stools.  CBC without leukocytosis.   BMP reveals hypokalemia (potassium 3.1), this actually improved from prior.  She does have a anion gap acidosis, which is likely related to dehydration and poor p.o. intake with ketones in the urine.  Patient given several fluid boluses, potassium, Reglan and Pepcid.  On recheck she reports that she feels much improved and is tolerating ginger ale at the bedside.  Her urine appears contaminated with many squamous cells, many bacteria and few white blood cells.  I doubt urinary tract infection, but have counseled patient to have her urine rechecked when she sees her obstetrician in three days.  Plan to discharge patient. She has diclegis and potassium at home.  Will also add Reglan once daily, have counseled her to let her obstetrician know about this.  I have also counseled her on return precautions.  She agrees and appears reliable.  Final Clinical Impressions(s) / ED Diagnoses   Final diagnoses:  Hyperemesis gravidarum    ED Discharge Orders         Ordered    metoCLOPramide (REGLAN) 10 MG tablet  Daily     10/07/17 1101           Kellie Shropshire, [place] 10/07/17 1107    Geoffery Lyons, MD 10/10/17 [phone]

## 2017-10-07 NOTE — Discharge Instructions (Addendum)
Please have your urine rechecked at your appointment with the women's doctor on Monday.  Continue taking your prescribed doxylamine and pyridoxine.  I have written you a prescription for Reglan to help with vomiting as well, please let your women's doctor know about this medicine when you see her on Monday.  Come back to the ER if you have any new or concerning symptoms like vomiting that will not stop, feeling lightheaded like you are going to pass out.

## 2017-10-07 NOTE — ED Notes (Signed)
Patient given discharge instructions and verbalized understanding.  Patient stable to discharge at this time.  Patient is alert and oriented to baseline.  No distressed noted at this time.  All belongings taken with the patient at discharge.   

## 2017-10-07 NOTE — ED Triage Notes (Signed)
Patient reports multiple/persistent emesis today with epigastric pain unrelieved by prescription medications , she is [redacted] weeks pregnant G3P2 , denies diarrhea / no fever.

## 2017-10-10 ENCOUNTER — Ambulatory Visit (INDEPENDENT_AMBULATORY_CARE_PROVIDER_SITE_OTHER): Payer: Medicaid Other | Admitting: Advanced Practice Midwife

## 2017-10-10 ENCOUNTER — Encounter: Payer: Self-pay | Admitting: Advanced Practice Midwife

## 2017-10-10 ENCOUNTER — Other Ambulatory Visit (HOSPITAL_COMMUNITY)
Admission: RE | Admit: 2017-10-10 | Discharge: 2017-10-10 | Disposition: A | Discharge status: Home / Self Care | Payer: Medicaid Other | Source: Ambulatory Visit | Attending: Advanced Practice Midwife | Admitting: Advanced Practice Midwife

## 2017-10-10 DIAGNOSIS — Z3A10 10 weeks gestation of pregnancy: Secondary | ICD-10-CM | POA: Insufficient documentation

## 2017-10-10 DIAGNOSIS — Z3401 Encounter for supervision of normal first pregnancy, first trimester: Secondary | ICD-10-CM | POA: Insufficient documentation

## 2017-10-10 DIAGNOSIS — N90810 Female genital mutilation status, unspecified: Secondary | ICD-10-CM | POA: Insufficient documentation

## 2017-10-10 DIAGNOSIS — Z3480 Encounter for supervision of other normal pregnancy, unspecified trimester: Secondary | ICD-10-CM | POA: Insufficient documentation

## 2017-10-10 DIAGNOSIS — Z98891 History of uterine scar from previous surgery: Secondary | ICD-10-CM | POA: Insufficient documentation

## 2017-10-10 DIAGNOSIS — O34219 Maternal care for unspecified type scar from previous cesarean delivery: Secondary | ICD-10-CM | POA: Insufficient documentation

## 2017-10-10 HISTORY — DX: Female genital mutilation status, unspecified: N90.810

## 2017-10-10 HISTORY — DX: History of uterine scar from previous surgery: Z98.891

## 2017-10-10 NOTE — Progress Notes (Signed)
Subjective:   Dorothy Haas is a 27 y.o. G1P0 at [redacted]w[redacted]d by LMP, early ultrasound being seen today for her first obstetrical visit.  Her obstetrical history is significant for previous c-section x 2. Patient does intend to breast feed. Pregnancy history fully reviewed.  Patient reports heartburn, nausea and vomiting. Patient has been to [facility] ED several times. She has been given zofran and reglan. She states that the Zofran is not helping, and has not started the reglan at this time.   HISTORY: OB History  Gravida Para Term Preterm AB Living  3 2 2  0 0 2  SAB TAB Ectopic Multiple Live Births  0 0 0 0 2    # Outcome Date GA Lbr Len/2nd Weight Sex Delivery Anes PTL Lv  3 Current           2 Term 01/20/13    M CS-Unspec Spinal  LIV  1 Term 05/10/09    F CS-Unspec Spinal  LIV     Complications: Cephalopelvic Disproportion    Last pap smear was done Never and was NA  History reviewed. No pertinent past medical history. Past Surgical History:  Procedure Laterality Date  . CESAREAN SECTION     No family history on file. Social History   Tobacco Use  . Smoking status: Never Smoker  . Smokeless tobacco: Never Used  Substance Use Topics  . Alcohol use: Never    Frequency: Never  . Drug use: Never   Allergies  Allergen Reactions  . Eggs Or Egg-Derived Products Nausea And Vomiting   Current Outpatient Medications on File Prior to Visit  Medication Sig Dispense Refill  . metoCLOPramide (REGLAN) 10 MG tablet Take 1 tablet (10 mg total) by mouth daily. 30 tablet 0  . calcium carbonate (TUMS) 500 MG chewable tablet Chew 1 tablet (200 mg of elemental calcium total) by mouth daily. (Patient not taking: Reported on 10/07/2017) 30 tablet 0  . doxylamine, Sleep, (UNISOM) 25 MG tablet Take 0.5 tablets (12.5 mg total) by mouth at bedtime as needed (nausea). (Patient not taking: Reported on 10/07/2017) 20 tablet 0  . Doxylamine-Pyridoxine 10-10 MG TBEC Take 2 tablets by mouth at  bedtime. Take 2 tablets at bedtime for the first 2 days.  If you still have nausea or vomiting, start taking 1 tablet in the morning and continue 2 tablets in the evening.  If you still feel nausea, on day 4 start taking 1 tablet in the morning, 1 tablet in the afternoon and 2 tablets at bedtime. (Patient not taking: Reported on 10/07/2017) 60 tablet 0  . famotidine (PEPCID) 20 MG tablet Take 1 tablet (20 mg total) by mouth 2 (two) times daily. (Patient not taking: Reported on 10/07/2017) 30 tablet 0  . ondansetron (ZOFRAN ODT) 4 MG disintegrating tablet Take 1 tablet (4 mg total) by mouth every 8 (eight) hours as needed for nausea or vomiting. (Patient not taking: Reported on 10/07/2017) 15 tablet 0  . potassium chloride SA (K-DUR,KLOR-CON) 20 MEQ tablet Take 1 tablet (20 mEq total) by mouth once for 1 dose. 3 tablet 0  . pyridOXINE (PYRIDOXINE) 25 MG tablet Take 1 tablet (25 mg total) by mouth every 8 (eight) hours as needed. (Patient not taking: Reported on 10/07/2017) 20 tablet 0  . sucralfate (CARAFATE) 1 g tablet Take 1 tablet (1 g total) by mouth 4 (four) times daily -  with meals and at bedtime. (Patient not taking: Reported on 10/07/2017) 30 tablet 0   No current  facility-administered medications on file prior to visit.     Review of Systems Pertinent items noted in HPI and remainder of comprehensive ROS otherwise negative.  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    Adnexa: normal adnexa and no mass, fullness, tenderness   Bony Pelvis: average  System: General: well-developed, well-nourished female in no acute distress   Breast:     Skin: normal coloration and turgor, no  rashes   Neurologic: oriented, normal, negative, normal mood   Extremities: normal strength, tone, and muscle mass, ROM of all joints is normal   HEENT PERRLA, extraocular movement intact and sclera clear, anicteric   Mouth/Teeth mucous membranes moist, pharynx normal without lesions and dental hygiene good   Neck supple and no masses   Cardiovascular: regular rate and rhythm   Respiratory:  no respiratory distress, normal breath sounds   Abdomen: soft, non-tender; bowel sounds normal; no masses,  no organomegaly    Assessment:   Pregnancy: G1P0 Patient Active Problem List   Diagnosis Date Noted  . Supervision of other normal pregnancy, antepartum 10/10/2017  . Previous cesarean section x 2 10/10/2017     Plan:  1. Supervision of other normal pregnancy, antepartum - Culture, OB Urine - [place] MFM OB COMP + 14 WK; Future - Obstetric Panel, Including HIV - Genetic Screening - HgB A1c - Cytology - PAP - Hemoglobinopathy Evaluation - Cystic Fibrosis Mutation 97 - SMN1 Copy Number Analysis   Initial labs drawn. Continue prenatal vitamins. Genetic Screening discussed, NIPS: requested. Ultrasound discussed; fetal anatomic survey: ordered. Problem list reviewed and updated. The nature of [place] [facility] with multiple MDs and other Advanced Practice Providers was explained to patient; also emphasized that residents, students are part of our team. Routine obstetric precautions reviewed. 50% of 45 min visit spent in counseling and coordination of care. Return in about 4 weeks (around 11/07/2017).

## 2017-10-10 NOTE — Patient Instructions (Signed)
Childbirth Education Options: [facility] Classes:  Childbirth education classes can help you get ready for a positive parenting experience. You can also meet other expectant parents and get free stuff for your baby. Each class runs for five weeks on the same night and costs $45 for the mother-to-be and her support person. Medicaid covers the cost if you are eligible. Call [phone] to register. [facility] Childbirth Education:  [phone] or [phone] or [email or web address]  Baby & Me Class: Discuss newborn & infant parenting and family adjustment issues with other new mothers in a relaxed environment. Each week brings a new speaker or baby-centered activity. We encourage new mothers to join us every Thursday at 11:00am. Babies birth until crawling. No registration or fee. Daddy Boot Camp: This course offers Dads-to-be the tools and knowledge needed to feel confident on their journey to becoming new fathers. Experienced dads, who have been trained as coaches, teach dads-to-be how to hold, comfort, diaper, swaddle and play with their infant while being able to support the new mom as well. A class for men taught by men. $25/dad Big Brother/Big Sister: Let your children share in the joy of a new brother or sister in this special class designed just for them. Class includes discussion about how families care for babies: swaddling, holding, diapering, safety as well as how they can be helpful in their new role. This class is designed for children ages 2 to 6, but any age is welcome. Please register each child individually. $5/child  Mom Talk: This mom-led group offers support and connection to mothers as they journey through the adjustments and struggles of that sometimes overwhelming first year after the birth of a child. Tuesdays at 10:00am and Thursdays at 6:00pm. Babies welcome. No registration or fee. Breastfeeding Support Group: This group is a mother-to-mother  support circle where moms have the opportunity to share their breastfeeding experiences. A Lactation Consultant is present for questions and concerns. Meets each Tuesday at 11:00am. No fee or registration. Breastfeeding Your Baby: Learn what to expect in the first days of breastfeeding your newborn.  This class will help you feel more confident with the skills needed to begin your breastfeeding experience. Many new mothers are concerned about breastfeeding after leaving the hospital. This class will also address the most common fears and challenges about breastfeeding during the first few weeks, months and beyond. (call for fee) Comfort Techniques and Tour: This 2 hour interactive class will provide you the opportunity to learn & practice hands-on techniques that can help relieve some of the discomfort of labor and encourage your baby to rotate toward the best position for birth. You and your partner will be able to try a variety of labor positions with birth balls and rebozos as well as practice breathing, relaxation, and visualization techniques. A tour of the [facility] is included with this class. $20 per registrant and support person Childbirth Class- Weekend Option: This class is a Weekend version of our Birth & Baby series. It is designed for parents who have a difficult time fitting several weeks of classes into their schedule. It covers the care of your newborn and the basics of labor and childbirth. It also includes a Maternity Care Center Tour of [facility] and lunch. The class is held two consecutive days: beginning on Friday evening from 6:30 - 8:30 p.m. and the next day, Saturday from 9 a.m. - 4 p.m. (call for fee) Waterbirth Class: Interested in a waterbirth?  This   informational class will help you discover whether waterbirth is the right fit for you. Education about waterbirth itself, supplies you would need and how to assemble your support team is what you can  expect from this class. Some obstetrical practices require this class in order to pursue a waterbirth. (Not all obstetrical practices offer waterbirth-check with your healthcare provider.) Register only the expectant mom, but you are encouraged to bring your partner to class! Required if planning waterbirth, no fee. Infant/Child CPR: Parents, grandparents, babysitters, and friends learn Cardio-Pulmonary Resuscitation skills for infants and children. You will also learn how to treat both conscious and unconscious choking in infants and children. This Family & Friends program does not offer certification. Register each participant individually to ensure that enough mannequins are available. (Call for fee) Grandparent Love: Expecting a grandbaby? This class is for you! Learn about the latest infant care and safety recommendations and ways to support your own child as he or she transitions into the parenting role. Taught by Registered Nurses who are childbirth instructors, but most importantly...they are grandmothers too! $10/person. Childbirth Class- Natural Childbirth: This series of 5 weekly classes is for expectant parents who want to learn and practice natural methods of coping with the process of labor and childbirth. Relaxation, breathing, massage, visualization, role of the partner, and helpful positioning are highlighted. Participants learn how to be confident in their body's ability to give birth. This class will empower and help parents make informed decisions about their own care. Includes discussion that will help new parents transition into the immediate postpartum period. Maternity Care Center Tour of [facility] is included. We suggest taking this class between 25-32 weeks, but it's only a recommendation. $75 per registrant and one support person or $30 Medicaid. Childbirth Class- 3 week Series: This option of 3 weekly classes helps you and your labor partner prepare for childbirth. Newborn  care, labor & birth, cesarean birth, pain management, and comfort techniques are discussed and a Maternity Care Center Tour of [facility] is included. The class meets at the same time, on the same day of the week for 3 consecutive weeks beginning with the starting date you choose. $60 for registrant and one support person.  Marvelous Multiples: Expecting twins, triplets, or more? This class covers the differences in labor, birth, parenting, and breastfeeding issues that face multiples' parents. NICU tour is included. Led by a Certified Childbirth Educator who is the mother of twins. No fee. Caring for Baby: This class is for expectant and adoptive parents who want to learn and practice the most up-to-date newborn care for their babies. Focus is on birth through the first six weeks of life. Topics include feeding, bathing, diapering, crying, umbilical cord care, circumcision care and safe sleep. Parents learn to recognize symptoms of illness and when to call the pediatrician. Register only the mom-to-be and your partner or support person can plan to come with you! $10 per registrant and support person Childbirth Class- online option: This online class offers you the freedom to complete a Birth and Baby series in the comfort of your own home. The flexibility of this option allows you to review sections at your own pace, at times convenient to you and your support people. It includes additional video information, animations, quizzes, and extended activities. Get organized with helpful eClass tools, checklists, and trackers. Once you register online for the class, you will receive an email within a few days to accept the invitation and begin the class when the time   is right for you. The content will be available to you for 60 days. $60 for 60 days of online access for you and your support people.  Local Doulas: [employer] Baby Doulas [email or web address] Tel:  [phone] [email or web address] [email or web address] [phone] [email or web address] [employer]  (also do waterbirth tub rental) [phone] [email or web address] [facility] [phone] [email or web address] [employer]  [phone] [email or web address] [place] [employer] ([employer]ADA) [email or web address].[email or web address].htm [place] [facility]  [email or web address] Considering Waterbirth? Guide for patients at [facility]  Why consider waterbirth?  . Gentle birth for babies . Less pain medicine used in labor . May allow for passive descent/less pushing . May reduce perineal tears  . More mobility and instinctive maternal position changes . Increased maternal relaxation . Reduced blood pressure in labor  Is waterbirth safe? What are the risks of infection, drowning or other complications?  . Infection: o Very low risk (3.7 % for tub vs 4.8% for bed) o 7 in 8000 waterbirths with documented infection o Poorly cleaned equipment most common cause o Slightly lower group B strep transmission rate  . Drowning o Maternal:  - Very low risk   - Related to seizures or fainting o Newborn:  - Very low risk. No evidence of increased risk of respiratory problems in multiple large studies - Physiological protection from breathing under water - Avoid underwater birth if there are any fetal complications - Once baby's head is out of the water, keep it out.  . Birth complication o Some reports of cord trauma, but risk decreased by bringing baby to surface gradually o No evidence of increased risk of shoulder dystocia. Mothers can usually change positions faster in water than in a bed, possibly aiding the maneuvers to free the shoulder.   You must attend a Waterbirth class at [facility]  3rd Wednesday of every month from 7-9pm  Free  Register by calling [phone] or online at [email or web address]  Bring us the certificate from the class to your prenatal appointment  Meet with a midwife at 36 weeks to see if you can still plan a waterbirth and to sign the consent.   Purchase or rent the following supplies:   Water Birth Pool (Birth Pool in a Box or [employer] for instance)  (Tubs start ~$125)  Single-use disposable tub liner designed for your brand of tub  New garden hose labeled "lead-free", "suitable for drinking water",  Electric drain pump to remove water (We recommend 792 gallon per hour or greater pump.)   Separate garden hose to remove the dirty water  Fish net  Bathing suit top (optional)  Long-handled mirror (optional)  Places to purchase or rent supplies  [email or web address] for tub purchases and supplies  [email or web address] for tub purchases and supplies  [employer] ([email or web address]) $275 for tub rental/set-up & take down/kit   [place] [employer] (http://www.padanc.org/MeetUs.htm) Information regarding doulas (labor support) who provide pool rentals  Our practice has a Birth Pool in a Box tub at the hospital that you may borrow on a first-come-first-served basis. It is your responsibility to to set up, clean and break down the tub. We cannot guarantee the availability of this tub in advance. You are responsible for bringing all accessories listed above. If you do not have all necessary supplies you cannot have a waterbirth.    Things that would prevent you from having a waterbirth:  Premature, <37wks  Previous cesarean birth  Presence of thick meconium-stained fluid  Multiple gestation (Twins,   triplets, etc.)  Uncontrolled diabetes or gestational diabetes requiring medication  Hypertension requiring medication or diagnosis of pre-eclampsia  Heavy vaginal bleeding  Non-reassuring fetal  heart rate  Active infection (MRSA, etc.). Group B Strep is NOT a contraindication for  waterbirth.  If your labor has to be induced and induction method requires continuous  monitoring of the baby's heart rate  Other risks/issues identified by your obstetrical provider  Please remember that birth is unpredictable. Under certain unforeseeable circumstances your provider may advise against giving birth in the tub. These decisions will be made on a case-by-case basis and with the safety of you and your baby as our highest priority.      

## 2017-10-11 LAB — CYTOLOGY - PAP
CHLAMYDIA, DNA PROBE: NEGATIVE
Diagnosis: NEGATIVE
Neisseria Gonorrhea: NEGATIVE

## 2017-10-12 ENCOUNTER — Other Ambulatory Visit: Payer: Self-pay

## 2017-10-12 ENCOUNTER — Encounter (HOSPITAL_COMMUNITY): Payer: Self-pay | Admitting: *Deleted

## 2017-10-12 ENCOUNTER — Inpatient Hospital Stay (HOSPITAL_COMMUNITY)
Admission: AD | Admit: 2017-10-12 | Discharge: 2017-10-14 | DRG: 833 | Disposition: A | Discharge status: Home / Self Care | Payer: Medicaid Other | Attending: Obstetrics and Gynecology | Admitting: Obstetrics and Gynecology

## 2017-10-12 DIAGNOSIS — O99280 Endocrine, nutritional and metabolic diseases complicating pregnancy, unspecified trimester: Secondary | ICD-10-CM

## 2017-10-12 DIAGNOSIS — O99282 Endocrine, nutritional and metabolic diseases complicating pregnancy, second trimester: Secondary | ICD-10-CM | POA: Diagnosis present

## 2017-10-12 DIAGNOSIS — R7401 Elevation of levels of liver transaminase levels: Secondary | ICD-10-CM

## 2017-10-12 DIAGNOSIS — E0590 Thyrotoxicosis, unspecified without thyrotoxic crisis or storm: Secondary | ICD-10-CM | POA: Clinically undetermined

## 2017-10-12 DIAGNOSIS — R748 Abnormal levels of other serum enzymes: Secondary | ICD-10-CM | POA: Diagnosis present

## 2017-10-12 DIAGNOSIS — O219 Vomiting of pregnancy, unspecified: Secondary | ICD-10-CM | POA: Diagnosis present

## 2017-10-12 DIAGNOSIS — O210 Mild hyperemesis gravidarum: Principal | ICD-10-CM | POA: Diagnosis present

## 2017-10-12 DIAGNOSIS — K117 Disturbances of salivary secretion: Secondary | ICD-10-CM

## 2017-10-12 DIAGNOSIS — R740 Nonspecific elevation of levels of transaminase and lactic acid dehydrogenase [LDH]: Secondary | ICD-10-CM | POA: Diagnosis present

## 2017-10-12 DIAGNOSIS — Z3A10 10 weeks gestation of pregnancy: Secondary | ICD-10-CM | POA: Diagnosis not present

## 2017-10-12 DIAGNOSIS — Z3480 Encounter for supervision of other normal pregnancy, unspecified trimester: Secondary | ICD-10-CM

## 2017-10-12 DIAGNOSIS — E876 Hypokalemia: Secondary | ICD-10-CM

## 2017-10-12 HISTORY — DX: Hypokalemia: E87.6

## 2017-10-12 HISTORY — DX: Elevation of levels of liver transaminase levels: R74.01

## 2017-10-12 HISTORY — DX: Hypomagnesemia: E83.42

## 2017-10-12 HISTORY — DX: Vomiting of pregnancy, unspecified: O21.9

## 2017-10-12 LAB — COMPREHENSIVE METABOLIC PANEL
ALBUMIN: 4.6 g/dL (ref 3.5–5.0)
ALT: 192 U/L — ABNORMAL HIGH (ref 0–44)
AST: 148 U/L — ABNORMAL HIGH (ref 15–41)
Alkaline Phosphatase: 56 U/L (ref 38–126)
Anion gap: 13 (ref 5–15)
BUN: 8 mg/dL (ref 6–20)
CHLORIDE: 105 mmol/L (ref 98–111)
CO2: 18 mmol/L — ABNORMAL LOW (ref 22–32)
Calcium: 9.9 mg/dL (ref 8.9–10.3)
Creatinine, Ser: 0.44 mg/dL (ref 0.44–1.00)
GFR calc Af Amer: >60 mL/min (ref >60)
GLUCOSE: 91 mg/dL (ref 70–99)
POTASSIUM: 3.2 mmol/L — AB (ref 3.5–5.1)
SODIUM: 136 mmol/L (ref 135–145)
Total Bilirubin: 1.8 mg/dL — ABNORMAL HIGH (ref 0.3–1.2)
Total Protein: 8.4 g/dL — ABNORMAL HIGH (ref 6.5–8.1)

## 2017-10-12 LAB — URINALYSIS, ROUTINE W REFLEX MICROSCOPIC
Bilirubin Urine: NEGATIVE
GLUCOSE, UA: NEGATIVE mg/dL
HGB URINE DIPSTICK: NEGATIVE
KETONES UR: 80 mg/dL — AB
Leukocytes, UA: NEGATIVE
Nitrite: NEGATIVE
PH: 6 (ref 5.0–8.0)
PROTEIN: 100 mg/dL — AB
SPECIFIC GRAVITY, URINE: 1.028 (ref 1.005–1.030)

## 2017-10-12 LAB — CBC WITH DIFFERENTIAL/PLATELET
BASOS ABS: 0 10*3/uL (ref 0.0–0.1)
BASOS PCT: 0 %
EOS PCT: 0 %
Eosinophils Absolute: 0 10*3/uL (ref 0.0–0.7)
HCT: 37.2 % (ref 36.0–46.0)
Hemoglobin: 13 g/dL (ref 12.0–15.0)
Lymphocytes Relative: 31 %
Lymphs Abs: 2.2 10*3/uL (ref 0.7–4.0)
MCH: 28.4 pg (ref 26.0–34.0)
MCHC: 34.9 g/dL (ref 30.0–36.0)
MCV: 81.4 fL (ref 78.0–100.0)
MONO ABS: 0.3 10*3/uL (ref 0.1–1.0)
Monocytes Relative: 4 %
Neutro Abs: 4.6 10*3/uL (ref 1.7–7.7)
Neutrophils Relative %: 65 %
PLATELETS: 197 10*3/uL (ref 150–400)
RBC: 4.57 MIL/uL (ref 3.87–5.11)
RDW: 12.5 % (ref 11.5–15.5)
WBC: 7.1 10*3/uL (ref 4.0–10.5)

## 2017-10-12 LAB — MAGNESIUM: MAGNESIUM: 1.6 mg/dL — AB (ref 1.7–2.4)

## 2017-10-12 LAB — T4, FREE: Free T4: 3.1 ng/dL — ABNORMAL HIGH (ref 0.82–1.77)

## 2017-10-12 LAB — LIPASE, BLOOD: LIPASE: 36 U/L (ref 11–51)

## 2017-10-12 LAB — AMYLASE: Amylase: 74 U/L (ref 28–100)

## 2017-10-12 LAB — TSH

## 2017-10-12 MED ORDER — FAMOTIDINE IN NACL 20-0.9 MG/50ML-% IV SOLN
20.0000 mg | Freq: Once | INTRAVENOUS | Status: AC
  Administered 2017-10-12: 20 mg via INTRAVENOUS
  Filled 2017-10-12: qty 50

## 2017-10-12 MED ORDER — FAMOTIDINE IN NACL 20-0.9 MG/50ML-% IV SOLN
20.0000 mg | Freq: Two times a day (BID) | INTRAVENOUS | Status: DC
  Filled 2017-10-12: qty 50

## 2017-10-12 MED ORDER — MAGNESIUM SULFATE 2 GM/50ML IV SOLN
2.0000 g | Freq: Once | INTRAVENOUS | Status: AC
  Administered 2017-10-12: 2 g via INTRAVENOUS
  Filled 2017-10-12 (×2): qty 50

## 2017-10-12 MED ORDER — PROMETHAZINE HCL 25 MG/ML IJ SOLN
25.0000 mg | Freq: Once | INTRAVENOUS | Status: AC
  Administered 2017-10-12: 25 mg via INTRAVENOUS
  Filled 2017-10-12: qty 1

## 2017-10-12 MED ORDER — PROMETHAZINE HCL 25 MG/ML IJ SOLN
25.0000 mg | Freq: Four times a day (QID) | INTRAMUSCULAR | Status: DC
  Administered 2017-10-12 – 2017-10-13 (×4): 25 mg via INTRAVENOUS
  Filled 2017-10-12 (×4): qty 1

## 2017-10-12 MED ORDER — ACETAMINOPHEN 325 MG PO TABS: 650.0000 mg | ORAL_TABLET | ORAL | Status: DC | PRN

## 2017-10-12 MED ORDER — POTASSIUM CHLORIDE IN NACL 40-0.9 MEQ/L-% IV SOLN
INTRAVENOUS | Status: DC
  Filled 2017-10-12 (×2): qty 1000

## 2017-10-12 MED ORDER — DIPHENHYDRAMINE HCL 25 MG PO CAPS
25.0000 mg | ORAL_CAPSULE | Freq: Two times a day (BID) | ORAL | Status: DC
  Administered 2017-10-12 – 2017-10-14 (×4): 25 mg via ORAL
  Filled 2017-10-12 (×4): qty 1

## 2017-10-12 MED ORDER — VITAMIN B-6 50 MG PO TABS
50.0000 mg | ORAL_TABLET | Freq: Every day | ORAL | Status: DC
  Administered 2017-10-13 – 2017-10-14 (×2): 50 mg via ORAL
  Filled 2017-10-12 (×4): qty 1

## 2017-10-12 MED ORDER — ZOLPIDEM TARTRATE 5 MG PO TABS: 5.0000 mg | ORAL_TABLET | Freq: Every evening | ORAL | Status: DC | PRN

## 2017-10-12 MED ORDER — SCOPOLAMINE 1 MG/3DAYS TD PT72
1.0000 | MEDICATED_PATCH | TRANSDERMAL | Status: DC
  Administered 2017-10-12: 1.5 mg via TRANSDERMAL
  Filled 2017-10-12: qty 1

## 2017-10-12 MED ORDER — SODIUM CHLORIDE 0.9 % IV SOLN
8.0000 mg | Freq: Once | INTRAVENOUS | Status: AC
  Administered 2017-10-12: 8 mg via INTRAVENOUS
  Filled 2017-10-12: qty 4

## 2017-10-12 MED ORDER — LACTATED RINGERS IV BOLUS
1000.0000 mL | Freq: Once | INTRAVENOUS | Status: AC
  Administered 2017-10-12: 1000 mL via INTRAVENOUS

## 2017-10-12 MED ORDER — SODIUM CHLORIDE 0.9 % IV SOLN
INTRAVENOUS | Status: DC
  Administered 2017-10-12 – 2017-10-14 (×6): via INTRAVENOUS
  Filled 2017-10-12 (×7): qty 1000

## 2017-10-12 NOTE — MAU Note (Signed)
Don't eat, poor appetite, when she does try to eat or drink she vomits, sometimes emesis is black/ or looks like there is blood.  abd pain.  Went to ER for this - they changed her medication and it did not help.

## 2017-10-12 NOTE — MAU Provider Note (Addendum)
History     CSN: [id number]  Arrival date and time: 10/12/17 1351   First Provider Initiated Contact with Patient 10/12/17 1442      Chief Complaint  Patient presents with  . Abdominal Pain  . Emesis  . no appetite   Dorothy Haas is a 27 y.o. G3P2002 at [redacted]w[redacted]d who presents today with nausea and vomiting. This is her 5th ED visit for the same complaint. She has tried all the medications that she has been given, but none of them help.  Emesis   This is a new problem. The current episode started more than 1 month ago. The problem occurs more than 10 times per day. The problem has been unchanged. The emesis has an appearance of stomach contents. There has been no fever. Risk factors: pregnancy  Treatments tried: zofran, reglan and pepcid  The treatment provided no relief.    OB History    Gravida  3   Para  2   Term  2   Preterm      AB      Living  2     SAB      TAB      Ectopic      Multiple      Live Births  2           No past medical history on file.  Past Surgical History:  Procedure Laterality Date  . CESAREAN SECTION     x 2    No family history on file.  Social History   Tobacco Use  . Smoking status: Never Smoker  . Smokeless tobacco: Never Used  Substance Use Topics  . Alcohol use: Never    Frequency: Never  . Drug use: Never    Allergies:  Allergies  Allergen Reactions  . Eggs Or Egg-Derived Products Nausea And Vomiting    Medications Prior to Admission  Medication Sig Dispense Refill Last Dose  . calcium carbonate (TUMS) 500 MG chewable tablet Chew 1 tablet (200 mg of elemental calcium total) by mouth daily. (Patient not taking: Reported on 10/07/2017) 30 tablet 0 Not Taking  . doxylamine, Sleep, (UNISOM) 25 MG tablet Take 0.5 tablets (12.5 mg total) by mouth at bedtime as needed (nausea). (Patient not taking: Reported on 10/07/2017) 20 tablet 0 Not Taking  . Doxylamine-Pyridoxine 10-10 MG TBEC Take 2 tablets by  mouth at bedtime. Take 2 tablets at bedtime for the first 2 days.  If you still have nausea or vomiting, start taking 1 tablet in the morning and continue 2 tablets in the evening.  If you still feel nausea, on day 4 start taking 1 tablet in the morning, 1 tablet in the afternoon and 2 tablets at bedtime. (Patient not taking: Reported on 10/07/2017) 60 tablet 0 Not Taking  . famotidine (PEPCID) 20 MG tablet Take 1 tablet (20 mg total) by mouth 2 (two) times daily. (Patient not taking: Reported on 10/07/2017) 30 tablet 0 Not Taking  . metoCLOPramide (REGLAN) 10 MG tablet Take 1 tablet (10 mg total) by mouth daily. 30 tablet 0 Taking  . ondansetron (ZOFRAN ODT) 4 MG disintegrating tablet Take 1 tablet (4 mg total) by mouth every 8 (eight) hours as needed for nausea or vomiting. (Patient not taking: Reported on 10/07/2017) 15 tablet 0 Not Taking  . potassium chloride SA (K-DUR,KLOR-CON) 20 MEQ tablet Take 1 tablet (20 mEq total) by mouth once for 1 dose. 3 tablet 0   . pyridOXINE (PYRIDOXINE) 25  MG tablet Take 1 tablet (25 mg total) by mouth every 8 (eight) hours as needed. (Patient not taking: Reported on 10/07/2017) 20 tablet 0 Not Taking  . sucralfate (CARAFATE) 1 g tablet Take 1 tablet (1 g total) by mouth 4 (four) times daily -  with meals and at bedtime. (Patient not taking: Reported on 10/07/2017) 30 tablet 0 Not Taking    Review of Systems  Gastrointestinal: Positive for vomiting.   Physical Exam   Blood pressure 112/85, pulse (!) 128, temperature 98.7 F (37.1 C), temperature source Oral, resp. rate 17, weight 45.6 kg, last menstrual period 08/01/2017, SpO2 100 %.  Physical Exam  Nursing note and vitals reviewed. Constitutional: She is oriented to person, place, and time. She appears well-developed and well-nourished. No distress.  HENT:  Head: Normocephalic.  Cardiovascular: Normal rate.  Respiratory: Effort normal.  GI: Soft. There is no tenderness.  Neurological: She is alert and  oriented to person, place, and time.  Skin: Skin is warm and dry.  Psychiatric: She has a normal mood and affect.     Pre pregnant weight 109lbs Total weight loss: 9.48lbs  With 4lbs weight loss since 10/10/17  Results for orders placed or performed during the hospital encounter of 10/12/17 (from the past 24 hour(s))  Urinalysis, Routine w reflex microscopic     Status: Abnormal   Collection Time: 10/12/17  2:53 PM  Result Value Ref Range   Color, Urine AMBER (A) YELLOW   APPearance HAZY (A) CLEAR   Specific Gravity, Urine 1.028 1.005 - 1.030   pH 6.0 5.0 - 8.0   Glucose, UA NEGATIVE NEGATIVE mg/dL   Hgb urine dipstick NEGATIVE NEGATIVE   Bilirubin Urine NEGATIVE NEGATIVE   Ketones, ur 80 (A) NEGATIVE mg/dL   Protein, ur 161 (A) NEGATIVE mg/dL   Nitrite NEGATIVE NEGATIVE   Leukocytes, UA NEGATIVE NEGATIVE   RBC / HPF 0-5 0 - 5 RBC/hpf   WBC, UA 0-5 0 - 5 WBC/hpf   Bacteria, UA RARE (A) NONE SEEN   Squamous Epithelial / LPF 0-5 0 - 5   Mucus PRESENT    Hyaline Casts, UA PRESENT   CBC with Differential/Platelet     Status: None   Collection Time: 10/12/17  3:18 PM  Result Value Ref Range   WBC 7.1 4.0 - 10.5 K/uL   RBC 4.57 3.87 - 5.11 MIL/uL   Hemoglobin 13.0 12.0 - 15.0 g/dL   HCT 09.6 04.5 - 40.9 %   MCV 81.4 78.0 - 100.0 fL   MCH 28.4 26.0 - 34.0 pg   MCHC 34.9 30.0 - 36.0 g/dL   RDW 81.1 91.4 - 78.2 %   Platelets 197 150 - 400 K/uL   Neutrophils Relative % 65 %   Neutro Abs 4.6 1.7 - 7.7 K/uL   Lymphocytes Relative 31 %   Lymphs Abs 2.2 0.7 - 4.0 K/uL   Monocytes Relative 4 %   Monocytes Absolute 0.3 0.1 - 1.0 K/uL   Eosinophils Relative 0 %   Eosinophils Absolute 0.0 0.0 - 0.7 K/uL   Basophils Relative 0 %   Basophils Absolute 0.0 0.0 - 0.1 K/uL  Comprehensive metabolic panel     Status: Abnormal   Collection Time: 10/12/17  3:18 PM  Result Value Ref Range   Sodium 136 135 - 145 mmol/L   Potassium 3.2 (L) 3.5 - 5.1 mmol/L   Chloride 105 98 - 111 mmol/L    CO2 18 (L) 22 - 32 mmol/L  Glucose, Bld 91 70 - 99 mg/dL   BUN 8 6 - 20 mg/dL   Creatinine, Ser 7.42 0.44 - 1.00 mg/dL   Calcium 9.9 8.9 - 59.5 mg/dL   Total Protein 8.4 (H) 6.5 - 8.1 g/dL   Albumin 4.6 3.5 - 5.0 g/dL   AST 638 (H) 15 - 41 U/L   ALT 192 (H) 0 - 44 U/L   Alkaline Phosphatase 56 38 - 126 U/L   Total Bilirubin 1.8 (H) 0.3 - 1.2 mg/dL   GFR calc non Af Amer >60 >60 mL/min   GFR calc Af Amer >60 >60 mL/min   Anion gap 13 5 - 15  TSH     Status: Abnormal   Collection Time: 10/12/17  3:18 PM  Result Value Ref Range   TSH <0.010 (L) 0.350 - 4.500 uIU/mL   Pt informed that the ultrasound is considered a limited OB ultrasound and is not intended to be a complete ultrasound exam.  Patient also informed that the ultrasound is not being completed with the intent of assessing for fetal or placental anomalies or any pelvic abnormalities.  Explained that the purpose of today's ultrasound is to assess for  viability.  Patient acknowledges the purpose of the exam and the limitations of the study.    +FCA 150 MAU Course  Procedures  MDM Consult with Dr. Vergie Living. Will get hepatitis panel, thyriod labs and admit to ante  Patient has had 2L of IV fluid and phenergan while here. She is feeling somewhat better. Has had some ice chips.   Assessment and Plan  Hyperemesis Elevated liver enzymes Low TSH Admit to 3rd floor Dr. Vergie Living put in Admit orders.   Thressa Sheller 10/12/2017, 2:47 PM

## 2017-10-12 NOTE — H&P (Signed)
History     CSN: [id number]  Arrival date and time: 10/12/17 1351   First Provider Initiated Contact with Patient 10/12/17 1442      Chief Complaint  Patient presents with  . Abdominal Pain  . Emesis  . no appetite   Shanteria Khalfalla F Tatro is a 27 y.o. G3P2002 at [redacted]w[redacted]d who presents today with nausea and vomiting. This is her 5th ED visit for the same complaint. She has tried all the medications that she has been given, but none of them help.  Emesis   This is a new problem. The current episode started more than 1 month ago. The problem occurs more than 10 times per day. The problem has been unchanged. The emesis has an appearance of stomach contents. There has been no fever. Risk factors: pregnancy  Treatments tried: zofran, reglan and pepcid  The treatment provided no relief.    OB History    Gravida  3   Para  2   Term  2   Preterm      AB      Living  2     SAB      TAB      Ectopic      Multiple      Live Births  2           No past medical history on file.  Past Surgical History:  Procedure Laterality Date  . CESAREAN SECTION     x 2    No family history on file.  Social History   Tobacco Use  . Smoking status: Never Smoker  . Smokeless tobacco: Never Used  Substance Use Topics  . Alcohol use: Never    Frequency: Never  . Drug use: Never    Allergies:  Allergies  Allergen Reactions  . Eggs Or Egg-Derived Products Nausea And Vomiting    Medications Prior to Admission  Medication Sig Dispense Refill Last Dose  . calcium carbonate (TUMS) 500 MG chewable tablet Chew 1 tablet (200 mg of elemental calcium total) by mouth daily. (Patient not taking: Reported on 10/07/2017) 30 tablet 0 Not Taking  . doxylamine, Sleep, (UNISOM) 25 MG tablet Take 0.5 tablets (12.5 mg total) by mouth at bedtime as needed (nausea). (Patient not taking: Reported on 10/07/2017) 20 tablet 0 Not Taking  . Doxylamine-Pyridoxine 10-10 MG TBEC Take 2 tablets by  mouth at bedtime. Take 2 tablets at bedtime for the first 2 days.  If you still have nausea or vomiting, start taking 1 tablet in the morning and continue 2 tablets in the evening.  If you still feel nausea, on day 4 start taking 1 tablet in the morning, 1 tablet in the afternoon and 2 tablets at bedtime. (Patient not taking: Reported on 10/07/2017) 60 tablet 0 Not Taking  . famotidine (PEPCID) 20 MG tablet Take 1 tablet (20 mg total) by mouth 2 (two) times daily. (Patient not taking: Reported on 10/07/2017) 30 tablet 0 Not Taking  . metoCLOPramide (REGLAN) 10 MG tablet Take 1 tablet (10 mg total) by mouth daily. 30 tablet 0 Taking  . ondansetron (ZOFRAN ODT) 4 MG disintegrating tablet Take 1 tablet (4 mg total) by mouth every 8 (eight) hours as needed for nausea or vomiting. (Patient not taking: Reported on 10/07/2017) 15 tablet 0 Not Taking  . potassium chloride SA (K-DUR,KLOR-CON) 20 MEQ tablet Take 1 tablet (20 mEq total) by mouth once for 1 dose. 3 tablet 0   . pyridOXINE (PYRIDOXINE) 25   MG tablet Take 1 tablet (25 mg total) by mouth every 8 (eight) hours as needed. (Patient not taking: Reported on 10/07/2017) 20 tablet 0 Not Taking  . sucralfate (CARAFATE) 1 g tablet Take 1 tablet (1 g total) by mouth 4 (four) times daily -  with meals and at bedtime. (Patient not taking: Reported on 10/07/2017) 30 tablet 0 Not Taking    Review of Systems  Gastrointestinal: Positive for vomiting.   Physical Exam   Blood pressure 112/85, pulse (!) 128, temperature 98.7 F (37.1 C), temperature source Oral, resp. rate 17, weight 45.6 kg, last menstrual period 08/01/2017, SpO2 100 %.  Physical Exam  Nursing note and vitals reviewed. Constitutional: She is oriented to person, place, and time. She appears well-developed and well-nourished. No distress.  HENT:  Head: Normocephalic.  Cardiovascular: Normal rate.  Respiratory: Effort normal.  GI: Soft. There is no tenderness.  Neurological: She is alert and  oriented to person, place, and time.  Skin: Skin is warm and dry.  Psychiatric: She has a normal mood and affect.     Pre pregnant weight 109lbs Total weight loss: 9.48lbs  With 4lbs weight loss since 10/10/17  Results for orders placed or performed during the hospital encounter of 10/12/17 (from the past 24 hour(s))  Urinalysis, Routine w reflex microscopic     Status: Abnormal   Collection Time: 10/12/17  2:53 PM  Result Value Ref Range   Color, Urine AMBER (A) YELLOW   APPearance HAZY (A) CLEAR   Specific Gravity, Urine 1.028 1.005 - 1.030   pH 6.0 5.0 - 8.0   Glucose, UA NEGATIVE NEGATIVE mg/dL   Hgb urine dipstick NEGATIVE NEGATIVE   Bilirubin Urine NEGATIVE NEGATIVE   Ketones, ur 80 (A) NEGATIVE mg/dL   Protein, ur 100 (A) NEGATIVE mg/dL   Nitrite NEGATIVE NEGATIVE   Leukocytes, UA NEGATIVE NEGATIVE   RBC / HPF 0-5 0 - 5 RBC/hpf   WBC, UA 0-5 0 - 5 WBC/hpf   Bacteria, UA RARE (A) NONE SEEN   Squamous Epithelial / LPF 0-5 0 - 5   Mucus PRESENT    Hyaline Casts, UA PRESENT   CBC with Differential/Platelet     Status: None   Collection Time: 10/12/17  3:18 PM  Result Value Ref Range   WBC 7.1 4.0 - 10.5 K/uL   RBC 4.57 3.87 - 5.11 MIL/uL   Hemoglobin 13.0 12.0 - 15.0 g/dL   HCT 37.2 36.0 - 46.0 %   MCV 81.4 78.0 - 100.0 fL   MCH 28.4 26.0 - 34.0 pg   MCHC 34.9 30.0 - 36.0 g/dL   RDW 12.5 11.5 - 15.5 %   Platelets 197 150 - 400 K/uL   Neutrophils Relative % 65 %   Neutro Abs 4.6 1.7 - 7.7 K/uL   Lymphocytes Relative 31 %   Lymphs Abs 2.2 0.7 - 4.0 K/uL   Monocytes Relative 4 %   Monocytes Absolute 0.3 0.1 - 1.0 K/uL   Eosinophils Relative 0 %   Eosinophils Absolute 0.0 0.0 - 0.7 K/uL   Basophils Relative 0 %   Basophils Absolute 0.0 0.0 - 0.1 K/uL  Comprehensive metabolic panel     Status: Abnormal   Collection Time: 10/12/17  3:18 PM  Result Value Ref Range   Sodium 136 135 - 145 mmol/L   Potassium 3.2 (L) 3.5 - 5.1 mmol/L   Chloride 105 98 - 111 mmol/L    CO2 18 (L) 22 - 32 mmol/L     Glucose, Bld 91 70 - 99 mg/dL   BUN 8 6 - 20 mg/dL   Creatinine, Ser 0.44 0.44 - 1.00 mg/dL   Calcium 9.9 8.9 - 10.3 mg/dL   Total Protein 8.4 (H) 6.5 - 8.1 g/dL   Albumin 4.6 3.5 - 5.0 g/dL   AST 148 (H) 15 - 41 U/L   ALT 192 (H) 0 - 44 U/L   Alkaline Phosphatase 56 38 - 126 U/L   Total Bilirubin 1.8 (H) 0.3 - 1.2 mg/dL   GFR calc non Af Amer >60 >60 mL/min   GFR calc Af Amer >60 >60 mL/min   Anion gap 13 5 - 15  TSH     Status: Abnormal   Collection Time: 10/12/17  3:18 PM  Result Value Ref Range   TSH <0.010 (L) 0.350 - 4.500 uIU/mL   Pt informed that the ultrasound is considered a limited OB ultrasound and is not intended to be a complete ultrasound exam.  Patient also informed that the ultrasound is not being completed with the intent of assessing for fetal or placental anomalies or any pelvic abnormalities.  Explained that the purpose of today's ultrasound is to assess for  viability.  Patient acknowledges the purpose of the exam and the limitations of the study.    +FCA 150 MAU Course  Procedures  MDM Consult with Dr. Pickens. Will get hepatitis panel, thyriod labs and admit to ante  Patient has had 2L of IV fluid and phenergan while here. She is feeling somewhat better. Has had some ice chips.   Assessment and Plan  Hyperemesis Elevated liver enzymes Low TSH Admit to 3rd floor Dr. Pickens put in Admit orders.   Kino Dunsworth 10/12/2017, 2:47 PM  

## 2017-10-13 DIAGNOSIS — O99280 Endocrine, nutritional and metabolic diseases complicating pregnancy, unspecified trimester: Secondary | ICD-10-CM

## 2017-10-13 DIAGNOSIS — E0590 Thyrotoxicosis, unspecified without thyrotoxic crisis or storm: Secondary | ICD-10-CM

## 2017-10-13 HISTORY — DX: Endocrine, nutritional and metabolic diseases complicating pregnancy, unspecified trimester: O99.280

## 2017-10-13 HISTORY — DX: Thyrotoxicosis, unspecified without thyrotoxic crisis or storm: E05.90

## 2017-10-13 LAB — HEPATITIS PANEL, ACUTE
HCV Ab: <0.1 s/co ratio (ref 0.0–0.9)
HEP A IGM: NEGATIVE
Hep B C IgM: NEGATIVE
Hepatitis B Surface Ag: NEGATIVE

## 2017-10-13 LAB — COMPREHENSIVE METABOLIC PANEL
ALT: 106 U/L — ABNORMAL HIGH (ref 0–44)
AST: 64 U/L — AB (ref 15–41)
Albumin: 2.9 g/dL — ABNORMAL LOW (ref 3.5–5.0)
Alkaline Phosphatase: 35 U/L — ABNORMAL LOW (ref 38–126)
Anion gap: 9 (ref 5–15)
CHLORIDE: 111 mmol/L (ref 98–111)
CO2: 17 mmol/L — ABNORMAL LOW (ref 22–32)
Calcium: 8.1 mg/dL — ABNORMAL LOW (ref 8.9–10.3)
Creatinine, Ser: 0.37 mg/dL — ABNORMAL LOW (ref 0.44–1.00)
GFR calc Af Amer: >60 mL/min (ref >60)
GLUCOSE: 72 mg/dL (ref 70–99)
POTASSIUM: 3.4 mmol/L — AB (ref 3.5–5.1)
Sodium: 137 mmol/L (ref 135–145)
Total Bilirubin: 1.2 mg/dL (ref 0.3–1.2)
Total Protein: 5.3 g/dL — ABNORMAL LOW (ref 6.5–8.1)

## 2017-10-13 LAB — T3, FREE: T3, Free: 7.9 pg/mL — ABNORMAL HIGH (ref 2.0–4.4)

## 2017-10-13 MED ORDER — ONDANSETRON HCL 4 MG/2ML IJ SOLN
4.0000 mg | Freq: Four times a day (QID) | INTRAMUSCULAR | Status: DC | PRN
  Administered 2017-10-14: 4 mg via INTRAVENOUS
  Filled 2017-10-13: qty 2

## 2017-10-13 MED ORDER — FAMOTIDINE IN NACL 20-0.9 MG/50ML-% IV SOLN
20.0000 mg | Freq: Two times a day (BID) | INTRAVENOUS | Status: DC
  Administered 2017-10-13 – 2017-10-14 (×2): 20 mg via INTRAVENOUS
  Filled 2017-10-13 (×3): qty 50

## 2017-10-13 NOTE — Progress Notes (Signed)
Patient ID: Dorothy Haas, female   DOB: 11-Dec-1990, 27 y.o.   MRN: [id number]   Subjective: Interval History:Has not had emesis today. Feels nauseous only. Previously seen in ED and given Zofran, Diclegis, Pepcid. Nothing has really helped. Has had IVF in ED on several occasions. Has h/o HEG with 2 prior pregnancies and threw up until "5 months". Nothing worked for her then. This was in the [place]. She reports going in for IVF as needed during this time.  Objective: Vital signs in last 24 hours: Temp:  [98.2 F (36.8 C)-98.7 F (37.1 C)] 98.7 F (37.1 C) (10/03 1152) Pulse Rate:  [85-96] 96 (10/03 1152) Resp:  [18-20] 18 (10/03 1152) BP: (90-112)/(61-78) 90/70 (10/03 1152) SpO2:  [100 %] 100 % (10/03 1152) Weight:  [45.6 kg] 45.6 kg (10/02 2032)  Intake/Output from previous day: 10/02 0701 - 10/03 0700 In: -  Out: 200 [Urine:200] Intake/Output this shift: Total I/O In: 1508.7 [P.O.:300; I.V.:1208.7] Out: 100 [Urine:100]  General appearance: alert, cooperative and appears stated age Lungs: normal effort Heart: regular rate and rhythm Abdomen: soft, non-tender; bowel sounds normal; no masses,  no organomegaly Extremities: extremities normal, atraumatic, no cyanosis or edema  Results for orders placed or performed during the hospital encounter of 10/12/17 (from the past 24 hour(s))  Urinalysis, Routine w reflex microscopic     Status: Abnormal   Collection Time: 10/12/17  2:53 PM  Result Value Ref Range   Color, Urine AMBER (A) YELLOW   APPearance HAZY (A) CLEAR   Specific Gravity, Urine 1.028 1.005 - 1.030   pH 6.0 5.0 - 8.0   Glucose, UA NEGATIVE NEGATIVE mg/dL   Hgb urine dipstick NEGATIVE NEGATIVE   Bilirubin Urine NEGATIVE NEGATIVE   Ketones, ur 80 (A) NEGATIVE mg/dL   Protein, ur 409 (A) NEGATIVE mg/dL   Nitrite NEGATIVE NEGATIVE   Leukocytes, UA NEGATIVE NEGATIVE   RBC / HPF 0-5 0 - 5 RBC/hpf   WBC, UA 0-5 0 - 5 WBC/hpf   Bacteria, UA RARE (A) NONE SEEN    Squamous Epithelial / LPF 0-5 0 - 5   Mucus PRESENT    Hyaline Casts, UA PRESENT   CBC with Differential/Platelet     Status: None   Collection Time: 10/12/17  3:18 PM  Result Value Ref Range   WBC 7.1 4.0 - 10.5 K/uL   RBC 4.57 3.87 - 5.11 MIL/uL   Hemoglobin 13.0 12.0 - 15.0 g/dL   HCT 81.1 91.4 - 78.2 %   MCV 81.4 78.0 - 100.0 fL   MCH 28.4 26.0 - 34.0 pg   MCHC 34.9 30.0 - 36.0 g/dL   RDW 95.6 21.3 - 08.6 %   Platelets 197 150 - 400 K/uL   Neutrophils Relative % 65 %   Neutro Abs 4.6 1.7 - 7.7 K/uL   Lymphocytes Relative 31 %   Lymphs Abs 2.2 0.7 - 4.0 K/uL   Monocytes Relative 4 %   Monocytes Absolute 0.3 0.1 - 1.0 K/uL   Eosinophils Relative 0 %   Eosinophils Absolute 0.0 0.0 - 0.7 K/uL   Basophils Relative 0 %   Basophils Absolute 0.0 0.0 - 0.1 K/uL  Comprehensive metabolic panel     Status: Abnormal   Collection Time: 10/12/17  3:18 PM  Result Value Ref Range   Sodium 136 135 - 145 mmol/L   Potassium 3.2 (L) 3.5 - 5.1 mmol/L   Chloride 105 98 - 111 mmol/L   CO2 18 (L) 22 -  32 mmol/L   Glucose, Bld 91 70 - 99 mg/dL   BUN 8 6 - 20 mg/dL   Creatinine, Ser 4.09 0.44 - 1.00 mg/dL   Calcium 9.9 8.9 - 81.1 mg/dL   Total Protein 8.4 (H) 6.5 - 8.1 g/dL   Albumin 4.6 3.5 - 5.0 g/dL   AST 914 (H) 15 - 41 U/L   ALT 192 (H) 0 - 44 U/L   Alkaline Phosphatase 56 38 - 126 U/L   Total Bilirubin 1.8 (H) 0.3 - 1.2 mg/dL   GFR calc non Af Amer >60 >60 mL/min   GFR calc Af Amer >60 >60 mL/min   Anion gap 13 5 - 15  TSH     Status: Abnormal   Collection Time: 10/12/17  3:18 PM  Result Value Ref Range   TSH <0.010 (L) 0.350 - 4.500 uIU/mL  T3, free     Status: Abnormal   Collection Time: 10/12/17  3:18 PM  Result Value Ref Range   T3, Free 7.9 (H) 2.0 - 4.4 pg/mL  T4, free     Status: Abnormal   Collection Time: 10/12/17  3:18 PM  Result Value Ref Range   Free T4 3.10 (H) 0.82 - 1.77 ng/dL  Amylase     Status: None   Collection Time: 10/12/17  6:15 PM  Result Value Ref  Range   Amylase 74 28 - 100 U/L  Lipase, blood     Status: None   Collection Time: 10/12/17  6:15 PM  Result Value Ref Range   Lipase 36 11 - 51 U/L  Hepatitis panel, acute     Status: None   Collection Time: 10/12/17  6:15 PM  Result Value Ref Range   Hepatitis B Surface Ag Negative Negative   HCV Ab <0.1 0.0 - 0.9 s/co ratio   Hep A IgM Negative Negative   Hep B C IgM Negative Negative  Magnesium     Status: Abnormal   Collection Time: 10/12/17  6:15 PM  Result Value Ref Range   Magnesium 1.6 (L) 1.7 - 2.4 mg/dL  Comprehensive metabolic panel     Status: Abnormal   Collection Time: 10/13/17  6:59 AM  Result Value Ref Range   Sodium 137 135 - 145 mmol/L   Potassium 3.4 (L) 3.5 - 5.1 mmol/L   Chloride 111 98 - 111 mmol/L   CO2 17 (L) 22 - 32 mmol/L   Glucose, Bld 72 70 - 99 mg/dL   BUN <5 (L) 6 - 20 mg/dL   Creatinine, Ser 7.82 (L) 0.44 - 1.00 mg/dL   Calcium 8.1 (L) 8.9 - 10.3 mg/dL   Total Protein 5.3 (L) 6.5 - 8.1 g/dL   Albumin 2.9 (L) 3.5 - 5.0 g/dL   AST 64 (H) 15 - 41 U/L   ALT 106 (H) 0 - 44 U/L   Alkaline Phosphatase 35 (L) 38 - 126 U/L   Total Bilirubin 1.2 0.3 - 1.2 mg/dL   GFR calc non Af Amer >60 >60 mL/min   GFR calc Af Amer >60 >60 mL/min   Anion gap 9 5 - 15    Studies/Results: US Ob Comp Less 14 Wks  Result Date: 09/27/2017 CLINICAL DATA:  Two week history of pelvic pain; quantitative beta HCG of 248,645 EXAM: OBSTETRIC <14 WK ULTRASOUND TECHNIQUE: Transabdominal ultrasound was performed for evaluation of the gestation as well as the maternal uterus and adnexal regions. COMPARISON:  None. FINDINGS: Intrauterine gestational sac: Single Yolk sac:  Present Embryo:  Present Cardiac Activity: Present Heart Rate: 140 bpm CRL:   13.4 mm   7 w 4 d                  [facility]: May 12, 2018 Subchorionic hemorrhage: There is a small subchorionic hemorrhage. Maternal uterus/adnexae: The uterus and adnexal structures exhibit no abnormalities. IMPRESSION: Single viable IUP  with estimated gestational age of [redacted] weeks 4 days with estimated date of confinement of May 12, 2018. There is a small subchorionic hemorrhage. Electronically Signed   By: David  [place] M.D.   On: 09/27/2017 09:26    Scheduled Meds: . diphenhydrAMINE  25 mg Oral BID  . promethazine  25 mg Intravenous Q6H  . vitamin B-6  50 mg Oral Daily  . scopolamine  1 patch Transdermal Q72H   Continuous Infusions: . famotidine    . sodium chloride 0.9 % 1,000 mL with potassium chloride 40 mEq infusion 100 mL/hr at 10/13/17 0806   PRN Meds:acetaminophen, zolpidem  Assessment/Plan: Active Problems:   Transaminitis   Nausea and vomiting of pregnancy, antepartum   Nausea and vomiting during pregnancy prior to [redacted] weeks gestation   Hypokalemia   Hypomagnesemia   ?Hyperthyroidism  Continue IV Zofran, Phenergan, Benadryl, B6, Pepcid, Scopolamine patch. Continue IV hydration and electrolyte repletion.   LOS: 1 day   Reva Bores, MD 10/13/2017 2:50 PM

## 2017-10-13 NOTE — Progress Notes (Signed)
Dr. Shawnie Pons contacted about the insufficient urine output from the patient this afternoon. Ordered to increase continuous fluid rate to 150/hr.

## 2017-10-13 NOTE — Progress Notes (Signed)
Interpreter ipad used to educate pt about measuring I&Os. Pt stated that she will no longer dump urine.

## 2017-10-13 NOTE — Plan of Care (Signed)
Pts. Condition will continue to improve 

## 2017-10-14 DIAGNOSIS — K117 Disturbances of salivary secretion: Secondary | ICD-10-CM

## 2017-10-14 HISTORY — DX: Disturbances of salivary secretion: K11.7

## 2017-10-14 LAB — COMPREHENSIVE METABOLIC PANEL
ALT: 89 U/L — ABNORMAL HIGH (ref 0–44)
AST: 40 U/L (ref 15–41)
Albumin: 3.1 g/dL — ABNORMAL LOW (ref 3.5–5.0)
Alkaline Phosphatase: 34 U/L — ABNORMAL LOW (ref 38–126)
Anion gap: 8 (ref 5–15)
BILIRUBIN TOTAL: 1.1 mg/dL (ref 0.3–1.2)
CO2: 15 mmol/L — ABNORMAL LOW (ref 22–32)
CREATININE: 0.34 mg/dL — AB (ref 0.44–1.00)
Calcium: 8.2 mg/dL — ABNORMAL LOW (ref 8.9–10.3)
Chloride: 108 mmol/L (ref 98–111)
GFR calc Af Amer: >60 mL/min (ref >60)
Glucose, Bld: 67 mg/dL — ABNORMAL LOW (ref 70–99)
Potassium: 4.3 mmol/L (ref 3.5–5.1)
Sodium: 131 mmol/L — ABNORMAL LOW (ref 135–145)
Total Protein: 5.8 g/dL — ABNORMAL LOW (ref 6.5–8.1)

## 2017-10-14 LAB — MAGNESIUM: Magnesium: 1.4 mg/dL — ABNORMAL LOW (ref 1.7–2.4)

## 2017-10-14 MED ORDER — GLYCOPYRROLATE 1 MG PO TABS: 1.0000 mg | ORAL_TABLET | Freq: Three times a day (TID) | ORAL | 2 refills | Status: DC

## 2017-10-14 MED ORDER — SCOPOLAMINE 1 MG/3DAYS TD PT72: 1.0000 | MEDICATED_PATCH | TRANSDERMAL | 12 refills | Status: DC

## 2017-10-14 NOTE — Progress Notes (Signed)
Discharge teaching complete with pt. Pt understood all information and did not have any questions. Pt discharged home with family. Interpretor used for discharge

## 2017-10-14 NOTE — Discharge Summary (Signed)
Antenatal Physician Discharge Summary  Patient ID: Dorothy Haas MRN: [id number] DOB/AGE: 09-Mar-1990 27 y.o.  Admit date: 10/12/2017 Discharge date: 10/14/2017  Admission Diagnoses: Active Problems:   Transaminitis   Nausea and vomiting of pregnancy, antepartum   Nausea and vomiting during pregnancy prior to [redacted] weeks gestation   Hypokalemia   Hypomagnesemia   ?Hyperthyroidism    Ptyalism   Discharge Diagnoses: Same  Prenatal Procedures: none  Consults: None  Hospital Course:  This is a 27 y.o. [id number] with IUP at [redacted]w[redacted]d admitted for hyperemesis. Has had 10 lb weight loss, 5 trips to ED for IV hydration. On Zofran and phenergan. She has h/o this is prior pregnancies with resolution around 20 wks usually. She was admitted with N/V, Dehydration and electrolyte abnormalities. She was vigorously hydrated and had K repletion. She had IV anti-emetics, scopolamine patch and IV phenergan. She began hallucinating with IV phenergan and this was D/C's. kept NPO x 2 days. Had resolution of her elevated LFT's. She was tolerating po clears at time of discharge. She was spitting, but had no emesis. She was deemed stable for discharge to home with outpatient follow up.  Discharge Exam: Temp:  [98.6 F (37 C)-99.2 F (37.3 C)] 98.7 F (37.1 C) (10/04 1130) Pulse Rate:  [88-92] 89 (10/04 1130) Resp:  [17-18] 17 (10/04 1130) BP: (99-118)/(79-93) 117/81 (10/04 1130) SpO2:  [100 %] 100 % (10/04 1130) Weight:  [47.9 kg] 47.9 kg (10/04 0444) Physical Examination: CONSTITUTIONAL: Well-developed, well-nourished female in no acute distress.  HENT:  Normocephalic, atraumatic, External right and left ear normal. Oropharynx is clear and moist EYES: Conjunctivae and EOM are normal.  NECK: Normal range of motion, supple, no masses SKIN: Skin is warm and dry. No rash noted. NEUROLGIC: Alert and oriented to person, place, and time PSYCHIATRIC: Normal mood and affect. Normal behavior. Normal  judgment and thought content. CARDIOVASCULAR: Normal heart rate noted, regular rhythm RESPIRATORY: Effort and breath sounds normal MUSCULOSKELETAL: Normal range of motion. No edema  ABDOMEN: Soft, nontender, nondistended, gravid.   Significant Diagnostic Studies:  Results for orders placed or performed during the hospital encounter of 10/12/17 (from the past 168 hour(s))  Urinalysis, Routine w reflex microscopic   Collection Time: 10/12/17  2:53 PM  Result Value Ref Range   Color, Urine AMBER (A) YELLOW   APPearance HAZY (A) CLEAR   Specific Gravity, Urine 1.028 1.005 - 1.030   pH 6.0 5.0 - 8.0   Glucose, UA NEGATIVE NEGATIVE mg/dL   Hgb urine dipstick NEGATIVE NEGATIVE   Bilirubin Urine NEGATIVE NEGATIVE   Ketones, ur 80 (A) NEGATIVE mg/dL   Protein, ur 147 (A) NEGATIVE mg/dL   Nitrite NEGATIVE NEGATIVE   Leukocytes, UA NEGATIVE NEGATIVE   RBC / HPF 0-5 0 - 5 RBC/hpf   WBC, UA 0-5 0 - 5 WBC/hpf   Bacteria, UA RARE (A) NONE SEEN   Squamous Epithelial / LPF 0-5 0 - 5   Mucus PRESENT    Hyaline Casts, UA PRESENT   CBC with Differential/Platelet   Collection Time: 10/12/17  3:18 PM  Result Value Ref Range   WBC 7.1 4.0 - 10.5 K/uL   RBC 4.57 3.87 - 5.11 MIL/uL   Hemoglobin 13.0 12.0 - 15.0 g/dL   HCT 82.9 56.2 - 13.0 %   MCV 81.4 78.0 - 100.0 fL   MCH 28.4 26.0 - 34.0 pg   MCHC 34.9 30.0 - 36.0 g/dL   RDW 86.5 78.4 - 69.6 %   Platelets  197 150 - 400 K/uL   Neutrophils Relative % 65 %   Neutro Abs 4.6 1.7 - 7.7 K/uL   Lymphocytes Relative 31 %   Lymphs Abs 2.2 0.7 - 4.0 K/uL   Monocytes Relative 4 %   Monocytes Absolute 0.3 0.1 - 1.0 K/uL   Eosinophils Relative 0 %   Eosinophils Absolute 0.0 0.0 - 0.7 K/uL   Basophils Relative 0 %   Basophils Absolute 0.0 0.0 - 0.1 K/uL  Comprehensive metabolic panel   Collection Time: 10/12/17  3:18 PM  Result Value Ref Range   Sodium 136 135 - 145 mmol/L   Potassium 3.2 (L) 3.5 - 5.1 mmol/L   Chloride 105 98 - 111 mmol/L   CO2  18 (L) 22 - 32 mmol/L   Glucose, Bld 91 70 - 99 mg/dL   BUN 8 6 - 20 mg/dL   Creatinine, Ser 5.40 0.44 - 1.00 mg/dL   Calcium 9.9 8.9 - 98.1 mg/dL   Total Protein 8.4 (H) 6.5 - 8.1 g/dL   Albumin 4.6 3.5 - 5.0 g/dL   AST 191 (H) 15 - 41 U/L   ALT 192 (H) 0 - 44 U/L   Alkaline Phosphatase 56 38 - 126 U/L   Total Bilirubin 1.8 (H) 0.3 - 1.2 mg/dL   GFR calc non Af Amer >60 >60 mL/min   GFR calc Af Amer >60 >60 mL/min   Anion gap 13 5 - 15  TSH   Collection Time: 10/12/17  3:18 PM  Result Value Ref Range   TSH <0.010 (L) 0.350 - 4.500 uIU/mL  T3, free   Collection Time: 10/12/17  3:18 PM  Result Value Ref Range   T3, Free 7.9 (H) 2.0 - 4.4 pg/mL  T4, free   Collection Time: 10/12/17  3:18 PM  Result Value Ref Range   Free T4 3.10 (H) 0.82 - 1.77 ng/dL  Amylase   Collection Time: 10/12/17  6:15 PM  Result Value Ref Range   Amylase 74 28 - 100 U/L  Lipase, blood   Collection Time: 10/12/17  6:15 PM  Result Value Ref Range   Lipase 36 11 - 51 U/L  Hepatitis panel, acute   Collection Time: 10/12/17  6:15 PM  Result Value Ref Range   Hepatitis B Surface Ag Negative Negative   HCV Ab <0.1 0.0 - 0.9 s/co ratio   Hep A IgM Negative Negative   Hep B C IgM Negative Negative  Magnesium   Collection Time: 10/12/17  6:15 PM  Result Value Ref Range   Magnesium 1.6 (L) 1.7 - 2.4 mg/dL  Comprehensive metabolic panel   Collection Time: 10/13/17  6:59 AM  Result Value Ref Range   Sodium 137 135 - 145 mmol/L   Potassium 3.4 (L) 3.5 - 5.1 mmol/L   Chloride 111 98 - 111 mmol/L   CO2 17 (L) 22 - 32 mmol/L   Glucose, Bld 72 70 - 99 mg/dL   BUN <5 (L) 6 - 20 mg/dL   Creatinine, Ser 4.78 (L) 0.44 - 1.00 mg/dL   Calcium 8.1 (L) 8.9 - 10.3 mg/dL   Total Protein 5.3 (L) 6.5 - 8.1 g/dL   Albumin 2.9 (L) 3.5 - 5.0 g/dL   AST 64 (H) 15 - 41 U/L   ALT 106 (H) 0 - 44 U/L   Alkaline Phosphatase 35 (L) 38 - 126 U/L   Total Bilirubin 1.2 0.3 - 1.2 mg/dL   GFR calc non Af Amer >60 >60 mL/min  GFR calc Af Amer >60 >60 mL/min   Anion gap 9 5 - 15  Comprehensive metabolic panel   Collection Time: 10/14/17  5:46 AM  Result Value Ref Range   Sodium 131 (L) 135 - 145 mmol/L   Potassium 4.3 3.5 - 5.1 mmol/L   Chloride 108 98 - 111 mmol/L   CO2 15 (L) 22 - 32 mmol/L   Glucose, Bld 67 (L) 70 - 99 mg/dL   BUN <5 (L) 6 - 20 mg/dL   Creatinine, Ser 1.61 (L) 0.44 - 1.00 mg/dL   Calcium 8.2 (L) 8.9 - 10.3 mg/dL   Total Protein 5.8 (L) 6.5 - 8.1 g/dL   Albumin 3.1 (L) 3.5 - 5.0 g/dL   AST 40 15 - 41 U/L   ALT 89 (H) 0 - 44 U/L   Alkaline Phosphatase 34 (L) 38 - 126 U/L   Total Bilirubin 1.1 0.3 - 1.2 mg/dL   GFR calc non Af Amer >60 >60 mL/min   GFR calc Af Amer >60 >60 mL/min   Anion gap 8 5 - 15  Magnesium   Collection Time: 10/14/17  5:46 AM  Result Value Ref Range   Magnesium 1.4 (L) 1.7 - 2.4 mg/dL  Results for orders placed or performed in visit on 10/10/17 (from the past 168 hour(s))  Cytology - PAP   Collection Time: 10/10/17 12:00 AM  Result Value Ref Range   Adequacy      Satisfactory for evaluation  endocervical/transformation zone component PRESENT.   Diagnosis      NEGATIVE FOR INTRAEPITHELIAL LESIONS OR MALIGNANCY.   Chlamydia Negative    Neisseria gonorrhea Negative    Material Submitted CervicoVaginal Pap [ThinPrep Imaged]    US Ob Comp Less 14 Wks  Result Date: 09/27/2017 CLINICAL DATA:  Two week history of pelvic pain; quantitative beta HCG of 248,645 EXAM: OBSTETRIC <14 WK ULTRASOUND TECHNIQUE: Transabdominal ultrasound was performed for evaluation of the gestation as well as the maternal uterus and adnexal regions. COMPARISON:  None. FINDINGS: Intrauterine gestational sac: Single Yolk sac:  Present Embryo:  Present Cardiac Activity: Present Heart Rate: 140 bpm CRL:   13.4 mm   7 w 4 d                  [place] EDC: May 12, 2018 Subchorionic hemorrhage: There is a small subchorionic hemorrhage. Maternal uterus/adnexae: The uterus and adnexal structures exhibit no  abnormalities. IMPRESSION: Single viable IUP with estimated gestational age of [redacted] weeks 4 days with estimated date of confinement of May 12, 2018. There is a small subchorionic hemorrhage. Electronically Signed   By: David  [place] M.D.   On: 09/27/2017 09:26    Future Appointments  Date Time Provider Department Center  11/07/2017 10:55 AM Gwenevere Abbot, MD [facility]    Discharge Condition: Stable     Allergies as of 10/14/2017      Reactions   Eggs Or Egg-derived Products Nausea And Vomiting      Medication List    STOP taking these medications   calcium carbonate 500 MG chewable tablet Commonly known as:  TUMS - dosed in mg elemental calcium   doxylamine (Sleep) 25 MG tablet Commonly known as:  UNISOM   vitamin B-6 25 MG tablet Commonly known as:  pyridOXINE     TAKE these medications   Doxylamine-Pyridoxine 10-10 MG Tbec Take 2 tablets by mouth at bedtime. Take 2 tablets at bedtime for the first 2 days.  If you still have nausea  or vomiting, start taking 1 tablet in the morning and continue 2 tablets in the evening.  If you still feel nausea, on day 4 start taking 1 tablet in the morning, 1 tablet in the afternoon and 2 tablets at bedtime.   famotidine 20 MG tablet Commonly known as:  PEPCID Take 1 tablet (20 mg total) by mouth 2 (two) times daily.   glycopyrrolate 1 MG tablet Commonly known as:  ROBINUL Take 1 tablet (1 mg total) by mouth 3 (three) times daily.   metoCLOPramide 10 MG tablet Commonly known as:  REGLAN Take 1 tablet (10 mg total) by mouth daily.   ondansetron 4 MG disintegrating tablet Commonly known as:  ZOFRAN-ODT Take 1 tablet (4 mg total) by mouth every 8 (eight) hours as needed for nausea or vomiting.   scopolamine 1 MG/3DAYS Commonly known as:  TRANSDERM-SCOP Place 1 patch (1.5 mg total) onto the skin every 3 (three) days. Start taking on:  10/15/2017   sucralfate 1 g tablet Commonly known as:  CARAFATE Take 1 tablet (1 g total) by  mouth 4 (four) times daily -  with meals and at bedtime.      Follow-up Information    [facility] Follow up.   Specialty:  Obstetrics and Gynecology Contact information: [address] [place] [address] [phone]          Signed: Reva Bores M.D. 10/14/2017, 3:34 PM

## 2017-10-14 NOTE — Discharge Instructions (Signed)

## 2017-10-15 ENCOUNTER — Other Ambulatory Visit: Payer: Self-pay

## 2017-10-15 ENCOUNTER — Encounter (HOSPITAL_COMMUNITY): Payer: Self-pay | Admitting: *Deleted

## 2017-10-15 ENCOUNTER — Inpatient Hospital Stay (HOSPITAL_COMMUNITY)
Admission: AD | Admit: 2017-10-15 | Discharge: 2017-10-16 | Disposition: A | Discharge status: Home / Self Care | Payer: Medicaid Other | Source: Ambulatory Visit | Attending: Obstetrics & Gynecology | Admitting: Obstetrics & Gynecology

## 2017-10-15 DIAGNOSIS — R109 Unspecified abdominal pain: Secondary | ICD-10-CM | POA: Diagnosis present

## 2017-10-15 DIAGNOSIS — Z3A10 10 weeks gestation of pregnancy: Secondary | ICD-10-CM | POA: Diagnosis not present

## 2017-10-15 DIAGNOSIS — O219 Vomiting of pregnancy, unspecified: Secondary | ICD-10-CM | POA: Diagnosis not present

## 2017-10-15 DIAGNOSIS — Z3491 Encounter for supervision of normal pregnancy, unspecified, first trimester: Secondary | ICD-10-CM

## 2017-10-15 DIAGNOSIS — R824 Acetonuria: Secondary | ICD-10-CM

## 2017-10-15 DIAGNOSIS — O210 Mild hyperemesis gravidarum: Secondary | ICD-10-CM

## 2017-10-15 DIAGNOSIS — O26891 Other specified pregnancy related conditions, first trimester: Secondary | ICD-10-CM | POA: Diagnosis not present

## 2017-10-15 DIAGNOSIS — R1110 Vomiting, unspecified: Secondary | ICD-10-CM | POA: Diagnosis present

## 2017-10-15 DIAGNOSIS — Z3480 Encounter for supervision of other normal pregnancy, unspecified trimester: Secondary | ICD-10-CM

## 2017-10-15 LAB — URINALYSIS, ROUTINE W REFLEX MICROSCOPIC
BACTERIA UA: NONE SEEN
Bilirubin Urine: NEGATIVE
GLUCOSE, UA: NEGATIVE mg/dL
Ketones, ur: 80 mg/dL — AB
LEUKOCYTES UA: NEGATIVE
NITRITE: NEGATIVE
PROTEIN: 30 mg/dL — AB
Specific Gravity, Urine: 1.021 (ref 1.005–1.030)
pH: 5 (ref 5.0–8.0)

## 2017-10-15 MED ORDER — GI COCKTAIL ~~LOC~~
30.0000 mL | Freq: Once | ORAL | Status: AC
  Administered 2017-10-15: 30 mL via ORAL
  Filled 2017-10-15: qty 30

## 2017-10-15 MED ORDER — FAMOTIDINE IN NACL 20-0.9 MG/50ML-% IV SOLN
20.0000 mg | Freq: Once | INTRAVENOUS | Status: AC
  Administered 2017-10-16: 20 mg via INTRAVENOUS
  Filled 2017-10-15: qty 50

## 2017-10-15 MED ORDER — DIPHENHYDRAMINE HCL 50 MG/ML IJ SOLN
25.0000 mg | Freq: Once | INTRAMUSCULAR | Status: AC
  Administered 2017-10-16: 25 mg via INTRAVENOUS
  Filled 2017-10-15: qty 1

## 2017-10-15 MED ORDER — DEXTROSE IN LACTATED RINGERS 5 % IV SOLN
Freq: Once | INTRAVENOUS | Status: AC
  Administered 2017-10-16: via INTRAVENOUS

## 2017-10-15 MED ORDER — ONDANSETRON 4 MG PO TBDP
4.0000 mg | ORAL_TABLET | Freq: Once | ORAL | Status: AC
  Administered 2017-10-15: 4 mg via ORAL
  Filled 2017-10-15: qty 1

## 2017-10-15 NOTE — MAU Note (Addendum)
Pt reports vomiting 4 x today. Vomited up blood this evening. Pt was discharged yesterday and feeling better.

## 2017-10-15 NOTE — MAU Provider Note (Signed)
History     CSN: [id number]  Arrival date and time: 10/15/17 2245   First Provider Initiated Contact with Patient 10/15/17 2311     Chief Complaint  Patient presents with  . Abdominal Pain  . Emesis   HPI  Dorothy Haas is a 27 y.o. G3P2002 at [redacted]w[redacted]d who presents to MAU with chief complaint of acid reflux, onset this evening.  Patient states she ate "meat soup" this afternoon and subsequently vomited approximately four times. She now c/o pain 10/10 pain "all over" her esophagus and into her throat.   Patient was discharged from antepartum yesterday afternoon after treatment and evaluation for hyperemesis gravidarum. Patient is wearing Scopolamine patch as prescribed. States she took Zofran this morning but has not taken any medication since that time.  Denies vaginal bleeding, leaking of fluid, decreased fetal movement, fever, falls, or recent illness.  Denies back pain, abdominal pain, urinary symptoms.  OB History    Gravida  3   Para  2   Term  2   Preterm      AB      Living  2     SAB      TAB      Ectopic      Multiple      Live Births  2           History reviewed. No pertinent past medical history.  Past Surgical History:  Procedure Laterality Date  . CESAREAN SECTION     x 2    No family history on file.  Social History   Tobacco Use  . Smoking status: Never Smoker  . Smokeless tobacco: Never Used  Substance Use Topics  . Alcohol use: Never    Frequency: Never  . Drug use: Never    Allergies:  Allergies  Allergen Reactions  . Eggs Or Egg-Derived Products Nausea And Vomiting    Medications Prior to Admission  Medication Sig Dispense Refill Last Dose  . ondansetron (ZOFRAN ODT) 4 MG disintegrating tablet Take 1 tablet (4 mg total) by mouth every 8 (eight) hours as needed for nausea or vomiting. 15 tablet 0 10/15/2017 at Unknown time  . scopolamine (TRANSDERM-SCOP) 1 MG/3DAYS Place 1 patch (1.5 mg total) onto the skin  every 3 (three) days. 10 patch 12 10/15/2017 at Unknown time  . Doxylamine-Pyridoxine 10-10 MG TBEC Take 2 tablets by mouth at bedtime. Take 2 tablets at bedtime for the first 2 days.  If you still have nausea or vomiting, start taking 1 tablet in the morning and continue 2 tablets in the evening.  If you still feel nausea, on day 4 start taking 1 tablet in the morning, 1 tablet in the afternoon and 2 tablets at bedtime. (Patient not taking: Reported on 10/07/2017) 60 tablet 0 Not Taking  . famotidine (PEPCID) 20 MG tablet Take 1 tablet (20 mg total) by mouth 2 (two) times daily. (Patient not taking: Reported on 10/07/2017) 30 tablet 0 Not Taking  . glycopyrrolate (ROBINUL) 1 MG tablet Take 1 tablet (1 mg total) by mouth 3 (three) times daily. 90 tablet 2   . metoCLOPramide (REGLAN) 10 MG tablet Take 1 tablet (10 mg total) by mouth daily. 30 tablet 0 Past Month at Unknown time  . sucralfate (CARAFATE) 1 g tablet Take 1 tablet (1 g total) by mouth 4 (four) times daily -  with meals and at bedtime. (Patient not taking: Reported on 10/07/2017) 30 tablet 0 Not Taking  Review of Systems  Constitutional: Negative for chills and fever.  Respiratory: Negative for shortness of breath.   Gastrointestinal: Positive for nausea and vomiting.  Genitourinary: Negative for vaginal bleeding, vaginal discharge and vaginal pain.  Musculoskeletal: Negative for arthralgias.  Neurological: Negative for dizziness, light-headedness and headaches.  All other systems reviewed and are negative.  Physical Exam   Last menstrual period 08/01/2017.  Physical Exam  Nursing note and vitals reviewed. Constitutional: She is oriented to person, place, and time. She appears well-developed. She appears distressed.  HENT:  Mouth/Throat: Uvula is midline, oropharynx is clear and moist and mucous membranes are normal.  Eyes: Pupils are equal, round, and reactive to light. Conjunctivae are normal.  Cardiovascular: Normal rate and  intact distal pulses.  Respiratory: Effort normal. No respiratory distress.  GI: Soft. She exhibits no distension. There is no tenderness. There is no rebound, no guarding and no CVA tenderness.  Musculoskeletal: Normal range of motion.  Neurological: She is alert and oriented to person, place, and time.  Skin: Skin is warm and dry.  Psychiatric: She has a normal mood and affect. Judgment and thought content normal. She is agitated.   Patient is moaning loudly, yelling and rocking side to side upon initial assessment in MAU room 7. Also biting her hand in between episodes of spitting  Specks of blood visible in emesis  MAU Course/MDM  --Vomiting x 1 in MAU with frequent spitting.  --Patient very agitated, screaming and growling in triage bed --Discussed that vocalizations stress already irritated esophageal tissue --CBC and CMP collected, no significant difference from labs drawn prior to discharge 10/12/17 and 10/14/17 --Patient and husband state they were unaware of prescribed meds, she has not taken anything but Zofran x 1 at breakfast --Patient dozing and resting after medications administered in MAU   Patient Vitals for the past 24 hrs:  BP Temp Pulse Resp  10/16/17 0143 135/80 (!) 97.1 F (36.2 C) 89 18  10/15/17 2249 (!) 142/86 97.9 F (36.6 C) 96 20   Results for orders placed or performed during the hospital encounter of 10/15/17 (from the past 24 hour(s))  Urinalysis, Routine w reflex microscopic     Status: Abnormal   Collection Time: 10/15/17 11:00 PM  Result Value Ref Range   Color, Urine YELLOW YELLOW   APPearance CLEAR CLEAR   Specific Gravity, Urine 1.021 1.005 - 1.030   pH 5.0 5.0 - 8.0   Glucose, UA NEGATIVE NEGATIVE mg/dL   Hgb urine dipstick SMALL (A) NEGATIVE   Bilirubin Urine NEGATIVE NEGATIVE   Ketones, ur 80 (A) NEGATIVE mg/dL   Protein, ur 30 (A) NEGATIVE mg/dL   Nitrite NEGATIVE NEGATIVE   Leukocytes, UA NEGATIVE NEGATIVE   RBC / HPF 0-5 0 - 5  RBC/hpf   WBC, UA 0-5 0 - 5 WBC/hpf   Bacteria, UA NONE SEEN NONE SEEN   Squamous Epithelial / LPF 0-5 0 - 5   Mucus PRESENT    Granular Casts, UA PRESENT   CBC with Differential/Platelet     Status: Abnormal   Collection Time: 10/15/17 11:53 PM  Result Value Ref Range   WBC 11.1 (H) 4.0 - 10.5 K/uL   RBC 4.85 3.87 - 5.11 MIL/uL   Hemoglobin 13.9 12.0 - 15.0 g/dL   HCT 16.1 09.6 - 04.5 %   MCV 83.5 78.0 - 100.0 fL   MCH 28.7 26.0 - 34.0 pg   MCHC 34.3 30.0 - 36.0 g/dL   RDW 40.9 81.1 - 91.4 %  Platelets 192 150 - 400 K/uL   Neutrophils Relative % 88 %   Neutro Abs 9.8 (H) 1.7 - 7.7 K/uL   Lymphocytes Relative 10 %   Lymphs Abs 1.1 0.7 - 4.0 K/uL   Monocytes Relative 2 %   Monocytes Absolute 0.2 0.1 - 1.0 K/uL   Eosinophils Relative 0 %   Eosinophils Absolute 0.0 0.0 - 0.7 K/uL   Basophils Relative 0 %   Basophils Absolute 0.0 0.0 - 0.1 K/uL  Comprehensive metabolic panel     Status: Abnormal   Collection Time: 10/15/17 11:53 PM  Result Value Ref Range   Sodium 140 135 - 145 mmol/L   Potassium 4.0 3.5 - 5.1 mmol/L   Chloride 106 98 - 111 mmol/L   CO2 12 (L) 22 - 32 mmol/L   Glucose, Bld 130 (H) 70 - 99 mg/dL   BUN 8 6 - 20 mg/dL   Creatinine, Ser 8.29 0.44 - 1.00 mg/dL   Calcium 9.9 8.9 - 56.2 mg/dL   Total Protein 9.3 (H) 6.5 - 8.1 g/dL   Albumin 4.8 3.5 - 5.0 g/dL   AST 36 15 - 41 U/L   ALT 86 (H) 0 - 44 U/L   Alkaline Phosphatase 57 38 - 126 U/L   Total Bilirubin 1.5 (H) 0.3 - 1.2 mg/dL   GFR calc non Af Amer >60 >60 mL/min   GFR calc Af Amer >60 >60 mL/min   Anion gap 22 (H) 5 - 15    Meds ordered this encounter  Medications  . ondansetron (ZOFRAN-ODT) disintegrating tablet 4 mg  . gi cocktail (Maalox,Lidocaine,Donnatal)  . dextrose 5 % in lactated ringers infusion  . diphenhydrAMINE (BENADRYL) injection 25 mg  . famotidine (PEPCID) IVPB 20 mg premix  . glycopyrrolate (ROBINUL) tablet 1 mg   Assessment and Plan  --27 y.o. G3P2002 at [redacted]w[redacted]d  --Discomfort  r/t acid reflux in setting of hyperemesis gravidarum --FHT 176 by Doppler --Reprinted AVS from d/c. Reviewed and highlighted all prescribed meds, administration times and indication --Patient's husband verbalized understanding --Arabic iPad interpreter  --Discharge home in stable condition  F/U: [facility] 11/07/17  Calvert Cantor, CNM 10/15/2017, 2:07 AM

## 2017-10-16 DIAGNOSIS — R109 Unspecified abdominal pain: Secondary | ICD-10-CM

## 2017-10-16 DIAGNOSIS — Z3A10 10 weeks gestation of pregnancy: Secondary | ICD-10-CM

## 2017-10-16 DIAGNOSIS — O219 Vomiting of pregnancy, unspecified: Secondary | ICD-10-CM

## 2017-10-16 DIAGNOSIS — O26891 Other specified pregnancy related conditions, first trimester: Secondary | ICD-10-CM

## 2017-10-16 LAB — COMPREHENSIVE METABOLIC PANEL
ALBUMIN: 4.8 g/dL (ref 3.5–5.0)
ALK PHOS: 57 U/L (ref 38–126)
ALT: 86 U/L — ABNORMAL HIGH (ref 0–44)
ANION GAP: 22 — AB (ref 5–15)
AST: 36 U/L (ref 15–41)
BUN: 8 mg/dL (ref 6–20)
CHLORIDE: 106 mmol/L (ref 98–111)
CO2: 12 mmol/L — AB (ref 22–32)
Calcium: 9.9 mg/dL (ref 8.9–10.3)
Creatinine, Ser: 0.69 mg/dL (ref 0.44–1.00)
GFR calc non Af Amer: >60 mL/min (ref >60)
GLUCOSE: 130 mg/dL — AB (ref 70–99)
POTASSIUM: 4 mmol/L (ref 3.5–5.1)
SODIUM: 140 mmol/L (ref 135–145)
Total Bilirubin: 1.5 mg/dL — ABNORMAL HIGH (ref 0.3–1.2)
Total Protein: 9.3 g/dL — ABNORMAL HIGH (ref 6.5–8.1)

## 2017-10-16 LAB — CBC WITH DIFFERENTIAL/PLATELET
BASOS PCT: 0 %
Basophils Absolute: 0 10*3/uL (ref 0.0–0.1)
EOS ABS: 0 10*3/uL (ref 0.0–0.7)
Eosinophils Relative: 0 %
HCT: 40.5 % (ref 36.0–46.0)
HEMOGLOBIN: 13.9 g/dL (ref 12.0–15.0)
LYMPHS ABS: 1.1 10*3/uL (ref 0.7–4.0)
Lymphocytes Relative: 10 %
MCH: 28.7 pg (ref 26.0–34.0)
MCHC: 34.3 g/dL (ref 30.0–36.0)
MCV: 83.5 fL (ref 78.0–100.0)
MONOS PCT: 2 %
Monocytes Absolute: 0.2 10*3/uL (ref 0.1–1.0)
NEUTROS PCT: 88 %
Neutro Abs: 9.8 10*3/uL — ABNORMAL HIGH (ref 1.7–7.7)
PLATELETS: 192 10*3/uL (ref 150–400)
RBC: 4.85 MIL/uL (ref 3.87–5.11)
RDW: 12.8 % (ref 11.5–15.5)
WBC: 11.1 10*3/uL — AB (ref 4.0–10.5)

## 2017-10-16 MED ORDER — GLYCOPYRROLATE 1 MG PO TABS
1.0000 mg | ORAL_TABLET | Freq: Once | ORAL | Status: AC
  Administered 2017-10-16: 1 mg via ORAL
  Filled 2017-10-16: qty 1

## 2017-10-16 NOTE — Discharge Instructions (Signed)
Eating Plan for Hyperemesis Gravidarum °Hyperemesis gravidarum is a severe form of morning sickness. Because this condition causes severe nausea and vomiting, it can lead to dehydration, malnutrition, and weight loss. One way to lessen the symptoms of nausea and vomiting is to follow the eating plan for hyperemesis gravidarum. It is often used along with prescribed medicines to control your symptoms. °What can I do to relieve my symptoms? °Listen to your body. Everyone is different and has different preferences. Find what works best for you. Take any of the following actions that are helpful to you: °· Eat and drink slowly. °· Eat 5-6 small meals daily instead of 3 large meals. °· Eat crackers before you get out of bed in the morning. °· Try having a snack in the middle of the night. °· Starchy foods are usually tolerated well. Examples include cereal, toast, bread, potatoes, pasta, rice, and pretzels. °· Ginger may help with nausea. Add ¼ tsp ground ginger to hot tea or choose ginger tea. °· Try drinking 100% fruit juice or an electrolyte drink. An electrolyte drink contains sodium, potassium, and chloride. °· Continue to take your prenatal vitamins as told by your health care provider. If you are having trouble taking your prenatal vitamins, talk with your health care provider about different options. °· Include at least 1 serving of protein with your meals and snacks. Protein options include meats or poultry, beans, nuts, eggs, and yogurt. Try eating a protein-rich snack before bed. Examples of these snacks include cheese and crackers or half of a peanut butter or turkey sandwich. °· Consider eliminating foods that trigger your symptoms. These may include spicy foods, coffee, high-fat foods, very sweet foods, and acidic foods. °· Try meals that have more protein combined with bland, salty, lower-fat, and dry foods, such as nuts, seeds, pretzels, crackers, and cereal. °· Talk with your healthcare provider about  starting a supplement of vitamin B6. °· Have fluids that are cold, clear, and carbonated or sour. Examples include lemonade, ginger ale, lemon-lime soda, ice water, and sparkling water. °· Try lemon or mint tea. °· Try brushing your teeth or using a mouth rinse after meals. ° °What should I avoid to reduce my symptoms? °Avoiding some of the following things may help reduce your symptoms. °· Foods with strong smells. Try eating meals in well-ventilated areas that are free of odors. °· Drinking water or other beverages with meals. Try not to drink anything during the 30 minutes before and after your meals. °· Drinking more than 1 cup of fluid at a time. Sometimes using a straw helps. °· Fried or high-fat foods, such as butter and cream sauces. °· Spicy foods. °· Skipping meals as best as you can. Nausea can be more intense on an empty stomach. If you cannot tolerate food at that time, do not force it. Try sucking on ice chips or other frozen items, and make up for missed calories later. °· Lying down within 2 hours after eating. °· Environmental triggers. These may include smoky rooms, closed spaces, rooms with strong smells, warm or humid places, overly loud and noisy rooms, and rooms with motion or flickering lights. °· Quick and sudden changes in your movement. ° °This information is not intended to replace advice given to you by your health care provider. Make sure you discuss any questions you have with your health care provider. °Document Released: 10/25/2006 Document Revised: 08/27/2015 Document Reviewed: 07/29/2015 °Elsevier Interactive Patient Education © 2018 [employer] ° °

## 2017-10-17 ENCOUNTER — Encounter: Payer: Self-pay | Admitting: *Deleted

## 2017-10-18 LAB — OBSTETRIC PANEL, INCLUDING HIV
ANTIBODY SCREEN: NEGATIVE
BASOS ABS: 0 10*3/uL (ref 0.0–0.2)
BASOS: 0 %
EOS (ABSOLUTE): 0.1 10*3/uL (ref 0.0–0.4)
Eos: 1 %
HEMATOCRIT: 32.9 % — AB (ref 34.0–46.6)
HIV SCREEN 4TH GENERATION: NONREACTIVE
Hemoglobin: 11.1 g/dL (ref 11.1–15.9)
Hepatitis B Surface Ag: NEGATIVE
Immature Grans (Abs): 0 10*3/uL (ref 0.0–0.1)
Immature Granulocytes: 0 %
LYMPHS: 27 %
Lymphocytes Absolute: 1.4 10*3/uL (ref 0.7–3.1)
MCH: 28.2 pg (ref 26.6–33.0)
MCHC: 33.7 g/dL (ref 31.5–35.7)
MCV: 84 fL (ref 79–97)
MONOCYTES: 8 %
MONOS ABS: 0.4 10*3/uL (ref 0.1–0.9)
Neutrophils Absolute: 3.3 10*3/uL (ref 1.4–7.0)
Neutrophils: 64 %
Platelets: 153 10*3/uL (ref 150–450)
RBC: 3.94 x10E6/uL (ref 3.77–5.28)
RDW: 13 % (ref 12.3–15.4)
RPR Ser Ql: NONREACTIVE
RUBELLA: 8.56 {index} (ref >0.99)
Rh Factor: POSITIVE
WBC: 5.2 10*3/uL (ref 3.4–10.8)

## 2017-10-18 LAB — SMN1 COPY NUMBER ANALYSIS (SMA CARRIER SCREENING)

## 2017-10-18 LAB — CYSTIC FIBROSIS MUTATION 97: Interpretation: NOT DETECTED

## 2017-10-18 LAB — HEMOGLOBINOPATHY EVALUATION
FERRITIN: 144 ng/mL (ref 15–150)
HGB A2 QUANT: 2 % (ref 1.8–3.2)
HGB C: 0 %
HGB VARIANT: 0 %
Hgb A: 98 % (ref 96.4–98.8)
Hgb F Quant: 0 % (ref 0.0–2.0)
Hgb S: 0 %
Hgb Solubility: NEGATIVE

## 2017-10-18 LAB — HEMOGLOBIN A1C
Est. average glucose Bld gHb Est-mCnc: 108 mg/dL
HEMOGLOBIN A1C: 5.4 % (ref 4.8–5.6)

## 2017-10-22 ENCOUNTER — Encounter (HOSPITAL_COMMUNITY): Payer: Self-pay

## 2017-10-22 ENCOUNTER — Inpatient Hospital Stay (HOSPITAL_COMMUNITY)
Admission: AD | Admit: 2017-10-22 | Discharge: 2017-10-22 | Disposition: A | Discharge status: Home / Self Care | Payer: Medicaid Other | Source: Ambulatory Visit | Attending: Family Medicine | Admitting: Family Medicine

## 2017-10-22 DIAGNOSIS — O26891 Other specified pregnancy related conditions, first trimester: Secondary | ICD-10-CM | POA: Diagnosis not present

## 2017-10-22 DIAGNOSIS — O211 Hyperemesis gravidarum with metabolic disturbance: Secondary | ICD-10-CM | POA: Diagnosis not present

## 2017-10-22 DIAGNOSIS — E0590 Thyrotoxicosis, unspecified without thyrotoxic crisis or storm: Secondary | ICD-10-CM | POA: Diagnosis not present

## 2017-10-22 DIAGNOSIS — K5900 Constipation, unspecified: Secondary | ICD-10-CM

## 2017-10-22 DIAGNOSIS — R634 Abnormal weight loss: Secondary | ICD-10-CM | POA: Insufficient documentation

## 2017-10-22 DIAGNOSIS — R748 Abnormal levels of other serum enzymes: Secondary | ICD-10-CM | POA: Diagnosis not present

## 2017-10-22 DIAGNOSIS — R7401 Elevation of levels of liver transaminase levels: Secondary | ICD-10-CM

## 2017-10-22 DIAGNOSIS — O99281 Endocrine, nutritional and metabolic diseases complicating pregnancy, first trimester: Secondary | ICD-10-CM | POA: Insufficient documentation

## 2017-10-22 DIAGNOSIS — R740 Nonspecific elevation of levels of transaminase and lactic acid dehydrogenase [LDH]: Secondary | ICD-10-CM | POA: Diagnosis not present

## 2017-10-22 DIAGNOSIS — Z3A11 11 weeks gestation of pregnancy: Secondary | ICD-10-CM | POA: Insufficient documentation

## 2017-10-22 DIAGNOSIS — E876 Hypokalemia: Secondary | ICD-10-CM

## 2017-10-22 DIAGNOSIS — R109 Unspecified abdominal pain: Secondary | ICD-10-CM | POA: Diagnosis present

## 2017-10-22 DIAGNOSIS — O99611 Diseases of the digestive system complicating pregnancy, first trimester: Secondary | ICD-10-CM

## 2017-10-22 DIAGNOSIS — K117 Disturbances of salivary secretion: Secondary | ICD-10-CM

## 2017-10-22 LAB — COMPREHENSIVE METABOLIC PANEL
ALBUMIN: 3.6 g/dL (ref 3.5–5.0)
ALT: 277 U/L — AB (ref 0–44)
ANION GAP: 16 — AB (ref 5–15)
AST: 160 U/L — AB (ref 15–41)
Alkaline Phosphatase: 69 U/L (ref 38–126)
BUN: 5 mg/dL — ABNORMAL LOW (ref 6–20)
CALCIUM: 9.1 mg/dL (ref 8.9–10.3)
CO2: 20 mmol/L — ABNORMAL LOW (ref 22–32)
Chloride: 98 mmol/L (ref 98–111)
Creatinine, Ser: 0.31 mg/dL — ABNORMAL LOW (ref 0.44–1.00)
GFR calc Af Amer: >60 mL/min (ref >60)
GFR calc non Af Amer: >60 mL/min (ref >60)
GLUCOSE: 103 mg/dL — AB (ref 70–99)
Potassium: 2.3 mmol/L — CL (ref 3.5–5.1)
Sodium: 134 mmol/L — ABNORMAL LOW (ref 135–145)
Total Bilirubin: 1.7 mg/dL — ABNORMAL HIGH (ref 0.3–1.2)
Total Protein: 7.2 g/dL (ref 6.5–8.1)

## 2017-10-22 LAB — CBC WITH DIFFERENTIAL/PLATELET
BASOS ABS: 0 10*3/uL (ref 0.0–0.1)
BASOS PCT: 0 %
Eosinophils Absolute: 0.1 10*3/uL (ref 0.0–0.5)
Eosinophils Relative: 2 %
HEMATOCRIT: 34.1 % — AB (ref 36.0–46.0)
HEMOGLOBIN: 12.1 g/dL (ref 12.0–15.0)
LYMPHS ABS: 2.5 10*3/uL (ref 0.7–4.0)
Lymphocytes Relative: 33 %
MCH: 28.4 pg (ref 26.0–34.0)
MCHC: 35.5 g/dL (ref 30.0–36.0)
MCV: 80 fL (ref 80.0–100.0)
MONOS PCT: 8 %
Monocytes Absolute: 0.6 10*3/uL (ref 0.1–1.0)
NEUTROS ABS: 4.5 10*3/uL (ref 1.7–7.7)
NEUTROS PCT: 57 %
NRBC: 0 % (ref 0.0–0.2)
Platelets: 190 10*3/uL (ref 150–400)
RBC: 4.26 MIL/uL (ref 3.87–5.11)
RDW: 13 % (ref 11.5–15.5)
WBC: 7.6 10*3/uL (ref 4.0–10.5)

## 2017-10-22 LAB — URINALYSIS, ROUTINE W REFLEX MICROSCOPIC
BACTERIA UA: NONE SEEN
GLUCOSE, UA: NEGATIVE mg/dL
Hgb urine dipstick: NEGATIVE
Ketones, ur: 80 mg/dL — AB
Leukocytes, UA: NEGATIVE
Nitrite: NEGATIVE
PH: 6 (ref 5.0–8.0)
PROTEIN: 100 mg/dL — AB
Specific Gravity, Urine: 1.026 (ref 1.005–1.030)

## 2017-10-22 MED ORDER — BISACODYL 10 MG RE SUPP: 10.0000 mg | Freq: Every day | RECTAL | 1 refills | Status: DC | PRN

## 2017-10-22 MED ORDER — SODIUM CHLORIDE 0.9 % IV SOLN
8.0000 mg | Freq: Once | INTRAVENOUS | Status: DC
  Filled 2017-10-22: qty 4

## 2017-10-22 MED ORDER — LACTATED RINGERS IV BOLUS: 1000.0000 mL | Freq: Once | INTRAVENOUS | Status: DC

## 2017-10-22 MED ORDER — ONDANSETRON 8 MG PO TBDP
8.0000 mg | ORAL_TABLET | Freq: Once | ORAL | Status: AC
  Administered 2017-10-22: 8 mg via ORAL
  Filled 2017-10-22: qty 1

## 2017-10-22 MED ORDER — POTASSIUM CHLORIDE 20 MEQ/15ML (10%) PO SOLN
40.0000 meq | Freq: Once | ORAL | Status: AC
  Administered 2017-10-22: 40 meq via ORAL
  Filled 2017-10-22: qty 30

## 2017-10-22 MED ORDER — SUCRALFATE 1 GM/10ML PO SUSP: 1.0000 g | Freq: Three times a day (TID) | ORAL | 3 refills | Status: DC

## 2017-10-22 MED ORDER — SUCRALFATE 1 GM/10ML PO SUSP
1.0000 g | Freq: Once | ORAL | Status: AC
  Administered 2017-10-22: 1 g via ORAL
  Filled 2017-10-22: qty 10

## 2017-10-22 MED ORDER — ONDANSETRON 8 MG PO TBDP: 8.0000 mg | ORAL_TABLET | Freq: Three times a day (TID) | ORAL | 4 refills | Status: DC | PRN

## 2017-10-22 MED ORDER — ONDANSETRON HCL 4 MG PO TABS
4.0000 mg | ORAL_TABLET | Freq: Once | ORAL | Status: AC
  Administered 2017-10-22: 4 mg via ORAL
  Filled 2017-10-22: qty 1

## 2017-10-22 MED ORDER — POLYETHYLENE GLYCOL 3350 17 GM/SCOOP PO POWD: 17.0000 g | Freq: Every day | ORAL | 2 refills | Status: DC | PRN

## 2017-10-22 MED ORDER — POTASSIUM CHLORIDE 20 MEQ/15ML (10%) PO SOLN: 40.0000 meq | Freq: Two times a day (BID) | ORAL | 0 refills | Status: DC

## 2017-10-22 NOTE — Discharge Instructions (Signed)

## 2017-10-22 NOTE — MAU Note (Signed)
Dorothy Haas is a 27 y.o. at [redacted]w[redacted]d here in MAU reporting: +lower abdominal pain and +throat pain --"bubbles in my throat". Also reports that she feels like her heart is racing. Onset of complaint: throat x1 week and stomach pain started last night. Stomach pain started in upper portion of abdomen but now lower. Pain score: 9/10. Sharp stabbing pain in abdomen. Intermittent.  She states she has been throwing up a lot. States she is taking her medications given for nausea and reflux.  Vitals:   10/22/17 1316  BP: 126/85  Pulse: (!) 123  Resp: 16  Temp: 98.5 F (36.9 C)  SpO2: 99%     Lab orders placed from triage: ua Arabic interpreter used through [place] services : [place] [phone]  Patient states she can not leave a urine sample at this time.

## 2017-10-22 NOTE — MAU Provider Note (Signed)
Chief Complaint: Abdominal Pain and throat pain   First Provider Initiated Contact with Patient 10/22/17 1329     SUBJECTIVE HPI: Dorothy Haas is a 27 y.o. G3P2002 at [redacted]w[redacted]d who presents to Maternity Admissions reporting abdominal pain, throat pain, N/V (twice today--improved w/ Zofran). Also reports no BM X 1-1.5 weeks. Had ultrasound on 09/27/2017 showing a live intrauterine pregnancy. Has been seen in the ED and MAU several times for hyperemesis.  Admitted 10/12/2017 due to increase in liver enzymes and worsening symptoms.  Liver enzymes decreased to almost normal levels with treatment and improvement of nausea and vomiting and hydration.  Hepatitis panel negative.  Thyroid labs also abnormal, TSH low, T3 and free T4 elevated--thought to be secondary to hyperemesis.  Plan was made to repeat labs after nausea and vomiting have improved.  Patient states upper abdominal pain that she has had at previous visits as resolved and nausea and vomiting have improved significantly with Zofran and Pepcid.  States Carafate tablets have helped with throat pain but it still persists.    Location: low abd Quality: Sharp Severity: 9/10 on pain scale Duration: Low Abd pain x 1 day and throat pain x 1 week.  Context: Hyperemesis. Taking Zofran.  Timing: Intermittent Modifying factors: None. Hasn't tried anything for the pain.  No relationship to voiding or position changes Associated signs and symptoms: Negative for fever, chills, vaginal bleeding, vaginal discharge, urinary complaints, diarrhea.  Positive for nausea, vomiting, constipation.  History reviewed. No pertinent past medical history. OB History  Gravida Para Term Preterm AB Living  3 2 2     2   SAB TAB Ectopic Multiple Live Births          2    # Outcome Date GA Lbr Len/2nd Weight Sex Delivery Anes PTL Lv  3 Current           2 Term 01/20/13    M CS-Unspec Spinal  LIV  1 Term 05/10/09    F CS-Unspec Spinal  LIV     Complications:  Cephalopelvic Disproportion   Past Surgical History:  Procedure Laterality Date  . CESAREAN SECTION     x 2   Social History   Socioeconomic History  . Marital status: Married    Spouse name: Not on file  . Number of children: Not on file  . Years of education: Not on file  . Highest education level: Not on file  Occupational History  . Not on file  Social Needs  . Financial resource strain: Not on file  . Food insecurity:    Worry: Not on file    Inability: Not on file  . Transportation needs:    Medical: Not on file    Non-medical: Not on file  Tobacco Use  . Smoking status: Never Smoker  . Smokeless tobacco: Never Used  Substance and Sexual Activity  . Alcohol use: Never    Frequency: Never  . Drug use: Never  . Sexual activity: Yes    Birth control/protection: None  Lifestyle  . Physical activity:    Days per week: Not on file    Minutes per session: Not on file  . Stress: Not on file  Relationships  . Social connections:    Talks on phone: Not on file    Gets together: Not on file    Attends religious service: Not on file    Active member of club or organization: Not on file    Attends meetings of clubs  or organizations: Not on file    Relationship status: Not on file  . Intimate partner violence:    Fear of current or ex partner: No    Emotionally abused: No    Physically abused: No    Forced sexual activity: No  Other Topics Concern  . Not on file  Social History Narrative  . Not on file   History reviewed. No pertinent family history. No current facility-administered medications on file prior to encounter.    Current Outpatient Medications on File Prior to Encounter  Medication Sig Dispense Refill  . famotidine (PEPCID) 20 MG tablet Take 1 tablet (20 mg total) by mouth 2 (two) times daily. (Patient not taking: Reported on 10/07/2017) 30 tablet 0  . glycopyrrolate (ROBINUL) 1 MG tablet Take 1 tablet (1 mg total) by mouth 3 (three) times daily. 90  tablet 2  . metoCLOPramide (REGLAN) 10 MG tablet Take 1 tablet (10 mg total) by mouth daily. 30 tablet 0  . scopolamine (TRANSDERM-SCOP) 1 MG/3DAYS Place 1 patch (1.5 mg total) onto the skin every 3 (three) days. 10 patch 12  . sucralfate (CARAFATE) 1 g tablet Take 1 tablet (1 g total) by mouth 4 (four) times daily -  with meals and at bedtime. (Patient not taking: Reported on 10/07/2017) 30 tablet 0   Allergies  Allergen Reactions  . Eggs Or Egg-Derived Products Nausea And Vomiting  . Promethazine     Hallucination    I have reviewed patient's Past Medical Hx, Surgical Hx, Family Hx, Social Hx, medications and allergies.   Review of Systems  Constitutional: Negative for chills and fever.  HENT: Positive for sore throat. Negative for congestion and rhinorrhea.        Positive for ptyalism  Cardiovascular:       Fast heartbeat  Gastrointestinal: Positive for abdominal pain, constipation, nausea and vomiting. Negative for abdominal distention, blood in stool and diarrhea.  Genitourinary: Negative for dysuria, flank pain, hematuria, vaginal bleeding and vaginal discharge.  Neurological: Negative for dizziness.    OBJECTIVE Patient Vitals for the past 24 hrs:  BP Temp Temp src Pulse Resp SpO2 Weight  10/22/17 1815 133/90 98.8 F (37.1 C) Oral (!) 134 18 100 % -  10/22/17 1316 126/85 98.5 F (36.9 C) Oral (!) 123 16 99 % 43.6 kg  Continuous pulse ox: 115-125.  Constitutional: Well-developed, well-nourished female in no acute distress.  Skin: No pallor or diaphoresis. Mucus membranes dry Cardiovascular: Mild tachycardia Respiratory: normal rate and effort.  GI: Abd soft, mild tenderness throughout entire low abdomen from umbilicus down, fundus nonpalpable. pos BS x 4 Neurologic: Alert and oriented x 4.  GU: Neg CVAT.  PELVIC EXAM: NEFG, physiologic discharge, no blood noted, cervix closed  uterus size>dates, no adnexal tenderness or masses. No CMT.  Fetal heart rate 175 by  Doppler  LAB RESULTS Results for orders placed or performed during the hospital encounter of 10/22/17 (from the past 24 hour(s))  CBC with Differential/Platelet     Status: Abnormal   Collection Time: 10/22/17  2:05 PM  Result Value Ref Range   WBC 7.6 4.0 - 10.5 K/uL   RBC 4.26 3.87 - 5.11 MIL/uL   Hemoglobin 12.1 12.0 - 15.0 g/dL   HCT 16.1 (L) 09.6 - 04.5 %   MCV 80.0 80.0 - 100.0 fL   MCH 28.4 26.0 - 34.0 pg   MCHC 35.5 30.0 - 36.0 g/dL   RDW 40.9 81.1 - 91.4 %   Platelets 190 150 -  400 K/uL   nRBC 0.0 0.0 - 0.2 %   Neutrophils Relative % 57 %   Neutro Abs 4.5 1.7 - 7.7 K/uL   Lymphocytes Relative 33 %   Lymphs Abs 2.5 0.7 - 4.0 K/uL   Monocytes Relative 8 %   Monocytes Absolute 0.6 0.1 - 1.0 K/uL   Eosinophils Relative 2 %   Eosinophils Absolute 0.1 0.0 - 0.5 K/uL   Basophils Relative 0 %   Basophils Absolute 0.0 0.0 - 0.1 K/uL  Comprehensive metabolic panel     Status: Abnormal   Collection Time: 10/22/17  2:05 PM  Result Value Ref Range   Sodium 134 (L) 135 - 145 mmol/L   Potassium 2.3 (LL) 3.5 - 5.1 mmol/L   Chloride 98 98 - 111 mmol/L   CO2 20 (L) 22 - 32 mmol/L   Glucose, Bld 103 (H) 70 - 99 mg/dL   BUN 5 (L) 6 - 20 mg/dL   Creatinine, Ser 1.61 (L) 0.44 - 1.00 mg/dL   Calcium 9.1 8.9 - 09.6 mg/dL   Total Protein 7.2 6.5 - 8.1 g/dL   Albumin 3.6 3.5 - 5.0 g/dL   AST 045 (H) 15 - 41 U/L   ALT 277 (H) 0 - 44 U/L   Alkaline Phosphatase 69 38 - 126 U/L   Total Bilirubin 1.7 (H) 0.3 - 1.2 mg/dL   GFR calc non Af Amer >60 >60 mL/min   GFR calc Af Amer >60 >60 mL/min   Anion gap 16 (H) 5 - 15  Urinalysis, Routine w reflex microscopic     Status: Abnormal   Collection Time: 10/22/17  5:05 PM  Result Value Ref Range   Color, Urine AMBER (A) YELLOW   APPearance CLOUDY (A) CLEAR   Specific Gravity, Urine 1.026 1.005 - 1.030   pH 6.0 5.0 - 8.0   Glucose, UA NEGATIVE NEGATIVE mg/dL   Hgb urine dipstick NEGATIVE NEGATIVE   Bilirubin Urine SMALL (A) NEGATIVE    Ketones, ur 80 (A) NEGATIVE mg/dL   Protein, ur 409 (A) NEGATIVE mg/dL   Nitrite NEGATIVE NEGATIVE   Leukocytes, UA NEGATIVE NEGATIVE   RBC / HPF 21-50 0 - 5 RBC/hpf   Bacteria, UA NONE SEEN NONE SEEN   Mucus PRESENT     IMAGING N/A  MAU COURSE Orders Placed This Encounter  Procedures  . Urinalysis, Routine w reflex microscopic  . CBC with Differential/Platelet  . Comprehensive metabolic panel  . Pulse oximetry, continuous  . Insert peripheral IV  . Discharge patient   Meds ordered this encounter  Medications  . sucralfate (CARAFATE) 1 GM/10ML suspension 1 g  . ondansetron (ZOFRAN-ODT) disintegrating tablet 8 mg  . ondansetron (ZOFRAN) tablet 4 mg    Spit out most of previous Zofran ODT due top Ptyalism.  . potassium chloride 20 MEQ/15ML (10%) solution 40 mEq    Unable to acquire IV access after 4 attempts. Pt requesting Zofran OTD. Pt spit out significant amount of dose 2/2 Ptyalism. Addition 4 mg tab given and kept down.   Discussed Hx, VS, labs (specifically Potassium, AST, ALT), exam w/ Dr. Shawnie Pons. Agrees w/ POC. New orders: No further workup recommended for AST, ALT at this time. Repeat at [facility] visit.   Pt reports feeling better and ready for D/C.   MDM - Generalized low-mid abd pain in pregnancy likely 2/2 constipation 2/2 Zofran and poor PO intake. Cervix long and closed.  Nml IUP previously seen on [place]. Marland Kitchen No Sx infection.  Offered  Enema in MAU--refused. Recommend Dulcolax daily until BM, Miralax QD daily while on Zofran and PRN afterward.   - Hyperemesis. 14 lb weight loss, but pt states N/V have been improving. No vomiting in MAU. Nausea improved w/ Zofran. Tolerating PO liquids and meds. Refill Zofran ODT. Increase dose to 8 mg. Continue Phenergan suppositories. Recommend taking antiemetics on schedule.   - Hypokalemia 2/2 hyperemesis. KCl 40 mEq given in MAU. Rx BID x 5 days. Encourage dto take antiemetics on schedule and have small frequent meals  To improve PO  intake.   - Mild tachycardia from dehydration and hypokalemia. Pt declined further attempts at IV starts.Tolerating PO fluids and oral Potassium. Instructed to return if tachycardia worsens and/or unable to keep down PO's.  - Transaminitis 2/2 Hyperemesis. Previous neg Hepatitis panel ad improvement w/ better management of Hyperemesis. See above recommendations.   - Throat pain from vomiting. Exam benign. Not C/W infection. Resolved w/ Carafate liquid. Will change home Rx.   ASSESSMENT 1. Abdominal pain during pregnancy in first trimester   2. Elevated liver enzymes   3. Hyperemesis gravidarum with metabolic disturbance, antepartum   4. Unintentional weight loss   5. Hypokalemia due to excessive gastrointestinal loss of potassium   6. Constipation during pregnancy in first trimester   7. Ptyalism   8. Hyperthyroidism affecting pregnancy in first trimester   9. Transaminitis     PLAN Discharge home in stable condition. Hyperemesis and hypokalemia precautions   Follow-up Information    [facility] Follow up on 11/07/2017.   Specialty:  Obstetrics and Gynecology Why:  as scheduled or sooner as needed if symptoms worsen Contact information: [address] [place] [address] [phone]       [facility] Follow up.   Why:  in emergencies Contact information: [address] [id number] [address] [place] [address] [phone]         Allergies as of 10/22/2017      Reactions   Eggs Or Egg-derived Products Nausea And Vomiting   Promethazine    Hallucination      Medication List    STOP taking these medications   sucralfate 1 g tablet Commonly known as:  CARAFATE Replaced by:  sucralfate 1 GM/10ML suspension     TAKE these medications   bisacodyl 10 MG suppository Commonly known as:  DULCOLAX Place 1 suppository (10 mg total) rectally daily as needed for moderate constipation.    famotidine 20 MG tablet Commonly known as:  PEPCID Take 1 tablet (20 mg total) by mouth 2 (two) times daily.   glycopyrrolate 1 MG tablet Commonly known as:  ROBINUL Take 1 tablet (1 mg total) by mouth 3 (three) times daily.   metoCLOPramide 10 MG tablet Commonly known as:  REGLAN Take 1 tablet (10 mg total) by mouth daily.   ondansetron 8 MG disintegrating tablet Commonly known as:  ZOFRAN-ODT Take 1 tablet (8 mg total) by mouth every 8 (eight) hours as needed for nausea or vomiting. What changed:    medication strength  how much to take   polyethylene glycol powder powder Commonly known as:  GLYCOLAX/MIRALAX Take 17 g by mouth daily as needed for moderate constipation.   potassium chloride 20 MEQ/15ML (10%) Soln Take 30 mLs (40 mEq total) by mouth 2 (two) times daily for 5 days.   scopolamine 1 MG/3DAYS Commonly known as:  TRANSDERM-SCOP Place 1 patch (1.5 mg total) onto the skin every 3 (three) days.  sucralfate 1 GM/10ML suspension Commonly known as:  CARAFATE Take 10 mLs (1 g total) by mouth 4 (four) times daily -  with meals and at bedtime. Replaces:  sucralfate 1 g tablet        [place], [place], [place] 10/22/2017  6:21 PM

## 2017-11-07 ENCOUNTER — Ambulatory Visit (INDEPENDENT_AMBULATORY_CARE_PROVIDER_SITE_OTHER): Payer: Medicaid Other | Admitting: Family Medicine

## 2017-11-07 VITALS — BP 135/73 | HR 98 | Wt 97.4 lb

## 2017-11-07 DIAGNOSIS — O219 Vomiting of pregnancy, unspecified: Secondary | ICD-10-CM

## 2017-11-07 DIAGNOSIS — E876 Hypokalemia: Secondary | ICD-10-CM

## 2017-11-07 DIAGNOSIS — Z3480 Encounter for supervision of other normal pregnancy, unspecified trimester: Secondary | ICD-10-CM

## 2017-11-07 DIAGNOSIS — K219 Gastro-esophageal reflux disease without esophagitis: Secondary | ICD-10-CM

## 2017-11-07 MED ORDER — PRENATAL VITAMINS 0.8 MG PO TABS: 1.0000 | ORAL_TABLET | Freq: Every day | ORAL | 12 refills | Status: DC

## 2017-11-07 MED ORDER — FAMOTIDINE 20 MG PO TABS: 20.0000 mg | ORAL_TABLET | Freq: Two times a day (BID) | ORAL | 0 refills | Status: DC

## 2017-11-07 MED ORDER — FAMOTIDINE 20 MG PO TABS: 20.0000 mg | ORAL_TABLET | Freq: Two times a day (BID) | ORAL | 3 refills | Status: DC

## 2017-11-07 MED ORDER — METOCLOPRAMIDE HCL 10 MG PO TABS: 10.0000 mg | ORAL_TABLET | Freq: Every day | ORAL | 3 refills | Status: DC

## 2017-11-07 MED ORDER — METOCLOPRAMIDE HCL 10 MG PO TABS: 10.0000 mg | ORAL_TABLET | Freq: Every day | ORAL | 0 refills | Status: DC

## 2017-11-07 NOTE — Progress Notes (Signed)
[employer] underwriter

## 2017-11-07 NOTE — Patient Instructions (Signed)
Second Trimester of Pregnancy The second trimester is from week 13 through week 28, month 4 through 6. This is often the time in pregnancy that you feel your best. Often times, morning sickness has lessened or quit. You may have more energy, and you may get hungry more often. Your unborn baby (fetus) is growing rapidly. At the end of the sixth month, he or she is about 9 inches long and weighs about 1 pounds. You will likely feel the baby move (quickening) between 18 and 20 weeks of pregnancy.  Research childbirth classes and hospital preregistration at [email or web address]  Follow these instructions at home:  Avoid all smoking, herbs, and alcohol. Avoid drugs not approved by your doctor.  Do not use any tobacco products, including cigarettes, chewing tobacco, and electronic cigarettes. If you need help quitting, ask your doctor. You may get counseling or other support to help you quit.  Only take medicine as told by your doctor. Some medicines are safe and some are not during pregnancy.  Exercise only as told by your doctor. Stop exercising if you start having cramps.  Eat regular, healthy meals.  Wear a good support bra if your breasts are tender.  Do not use hot tubs, steam rooms, or saunas.  Wear your seat belt when driving.  Avoid raw meat, uncooked cheese, and liter boxes and soil used by cats.  Take your prenatal vitamins.  Take 1500-2000 milligrams of calcium daily starting at the 20th week of pregnancy until you deliver your baby.  Try taking medicine that helps you poop (stool softener) as needed, and if your doctor approves. Eat more fiber by eating fresh fruit, vegetables, and whole grains. Drink enough fluids to keep your pee (urine) clear or pale yellow.  Take warm water baths (sitz baths) to soothe pain or discomfort caused by hemorrhoids. Use hemorrhoid cream if your doctor approves.  If you have puffy, bulging veins (varicose veins), wear support hose. Raise  (elevate) your feet for 15 minutes, 3-4 times a day. Limit salt in your diet.  Avoid heavy lifting, wear low heals, and sit up straight.  Rest with your legs raised if you have leg cramps or low back pain.  Visit your dentist if you have not gone during your pregnancy. Use a soft toothbrush to brush your teeth. Be gentle when you floss.  You can have sex (intercourse) unless your doctor tells you not to.  Go to your doctor visits.  Get help if:  You feel dizzy.  You have mild cramps or pressure in your lower belly (abdomen).  You have a nagging pain in your belly area.  You continue to feel sick to your stomach (nauseous), throw up (vomit), or have watery poop (diarrhea).  You have bad smelling fluid coming from your vagina.  You have pain with peeing (urination). Get help right away if:  You have a fever.  You are leaking fluid from your vagina.  You have spotting or bleeding from your vagina.  You have severe belly cramping or pain.  You lose or gain weight rapidly.  You have trouble catching your breath and have chest pain.  You notice sudden or extreme puffiness (swelling) of your face, hands, ankles, feet, or legs.  You have not felt the baby move in over an hour.  You have severe headaches that do not go away with medicine.  You have vision changes. This information is not intended to replace advice given to you by your health care provider. Make   sure you discuss any questions you have with your health care provider. Document Released: 03/24/2009 Document Revised: 06/05/2015 Document Reviewed: 02/29/2012 Elsevier Interactive Patient Education  2017 Elsevier Inc.    

## 2017-11-07 NOTE — Progress Notes (Signed)
   PRENATAL VISIT NOTE  Subjective:  Dorothy Haas is a 27 y.o. G3P2002 at [redacted]w[redacted]d being seen today for ongoing prenatal care.  She is currently monitored for the following issues for this low-risk pregnancy and has Supervision of other normal pregnancy, antepartum; Previous cesarean section x 2; Female circumcision; Transaminitis; Nausea and vomiting of pregnancy, antepartum; Nausea and vomiting during pregnancy prior to [redacted] weeks gestation; Hypokalemia; Hypomagnesemia; ?Hyperthyroidism; Ptyalism; and Acid reflux on their problem list.  Patient reports heartburn and nausea. Would like refills of famotidine and reglan today.  Contractions: Not present. Vag. Bleeding: None.  Movement: Present. Denies leaking of fluid.   The following portions of the patient's history were reviewed and updated as appropriate: allergies, current medications, past family history, past medical history, past social history, past surgical history and problem list. Problem list updated.  Objective:   Vitals:   11/07/17 1123  BP: 135/73  Pulse: 98  Weight: 97 lb 6.4 oz (44.2 kg)    Fetal Status: Fetal Heart Rate (bpm): 143   Movement: Present     General:  Alert, oriented and cooperative. Patient is in no acute distress.  Skin: Skin is warm and dry. No rash noted.   Cardiovascular: Normal heart rate noted  Respiratory: Normal respiratory effort, no problems with respiration noted  Abdomen: Soft, gravid, appropriate for gestational age.  Pain/Pressure: Present     Pelvic: Cervical exam deferred        Extremities: Normal range of motion.  Edema: None  Mental Status: Normal mood and affect. Normal behavior. Normal judgment and thought content.   Assessment and Plan:  Pregnancy: G3P2002 at [redacted]w[redacted]d  1. Supervision of other normal pregnancy, antepartum - Doing well   2. Nausea and vomiting of pregnancy, antepartum - Refilled famotidine and reglan today   3. Gastroesophageal reflux disease,  esophagitis presence not specified - Refilled famotidine and reglan today   4. Hypokalemia - repeat labs ordered today - pt will return tomorrow morning for blood draw  Preterm labor symptoms and general obstetric precautions including but not limited to vaginal bleeding, contractions, leaking of fluid and fetal movement were reviewed in detail with the patient. Please refer to After Visit Summary for other counseling recommendations.  Return in about 4 weeks (around 12/05/2017) for ROB.  Future Appointments  Date Time Provider Department Center  11/08/2017  8:20 AM WOC-WOCA LAB WOC-WOCA WOC  12/06/2017  8:55 AM Dorathy Kinsman, CNM [facility] WOC  12/07/2017 10:15 AM WH-MFC [place] 4 WH-MFCUS MFC-US    Gwenevere Abbot, MD

## 2017-11-08 ENCOUNTER — Other Ambulatory Visit: Payer: Medicaid Other

## 2017-11-08 DIAGNOSIS — Z3480 Encounter for supervision of other normal pregnancy, unspecified trimester: Secondary | ICD-10-CM

## 2017-11-08 DIAGNOSIS — E876 Hypokalemia: Secondary | ICD-10-CM

## 2017-11-08 LAB — COMPREHENSIVE METABOLIC PANEL
ALK PHOS: 44 IU/L (ref 39–117)
ALT: 13 IU/L (ref 0–32)
AST: 17 IU/L (ref 0–40)
Albumin/Globulin Ratio: 1.7 (ref 1.2–2.2)
Albumin: 3.9 g/dL (ref 3.5–5.5)
BUN/Creatinine Ratio: 7 — ABNORMAL LOW (ref 9–23)
BUN: 3 mg/dL — ABNORMAL LOW (ref 6–20)
Bilirubin Total: 0.6 mg/dL (ref 0.0–1.2)
CO2: 18 mmol/L — AB (ref 20–29)
Calcium: 9.3 mg/dL (ref 8.7–10.2)
Chloride: 102 mmol/L (ref 96–106)
Creatinine, Ser: 0.42 mg/dL — ABNORMAL LOW (ref 0.57–1.00)
GFR calc Af Amer: 164 mL/min/{1.73_m2} (ref >59)
GFR calc non Af Amer: 142 mL/min/{1.73_m2} (ref >59)
GLOBULIN, TOTAL: 2.3 g/dL (ref 1.5–4.5)
GLUCOSE: 99 mg/dL (ref 65–99)
POTASSIUM: 3.6 mmol/L (ref 3.5–5.2)
SODIUM: 138 mmol/L (ref 134–144)
Total Protein: 6.2 g/dL (ref 6.0–8.5)

## 2017-11-28 ENCOUNTER — Encounter (HOSPITAL_COMMUNITY): Payer: Self-pay

## 2017-12-06 ENCOUNTER — Ambulatory Visit (INDEPENDENT_AMBULATORY_CARE_PROVIDER_SITE_OTHER): Payer: Medicaid Other | Admitting: Advanced Practice Midwife

## 2017-12-06 VITALS — BP 119/67 | HR 92 | Wt 103.7 lb

## 2017-12-06 DIAGNOSIS — R7401 Elevation of levels of liver transaminase levels: Secondary | ICD-10-CM

## 2017-12-06 DIAGNOSIS — Z23 Encounter for immunization: Secondary | ICD-10-CM

## 2017-12-06 DIAGNOSIS — Z98891 History of uterine scar from previous surgery: Secondary | ICD-10-CM

## 2017-12-06 DIAGNOSIS — O99282 Endocrine, nutritional and metabolic diseases complicating pregnancy, second trimester: Secondary | ICD-10-CM | POA: Diagnosis not present

## 2017-12-06 DIAGNOSIS — Z3A18 18 weeks gestation of pregnancy: Secondary | ICD-10-CM | POA: Diagnosis not present

## 2017-12-06 DIAGNOSIS — K219 Gastro-esophageal reflux disease without esophagitis: Secondary | ICD-10-CM | POA: Diagnosis not present

## 2017-12-06 DIAGNOSIS — R740 Nonspecific elevation of levels of transaminase and lactic acid dehydrogenase [LDH]: Secondary | ICD-10-CM

## 2017-12-06 DIAGNOSIS — O34219 Maternal care for unspecified type scar from previous cesarean delivery: Secondary | ICD-10-CM

## 2017-12-06 DIAGNOSIS — Z3480 Encounter for supervision of other normal pregnancy, unspecified trimester: Secondary | ICD-10-CM

## 2017-12-06 DIAGNOSIS — E0590 Thyrotoxicosis, unspecified without thyrotoxic crisis or storm: Secondary | ICD-10-CM | POA: Diagnosis not present

## 2017-12-06 DIAGNOSIS — O99612 Diseases of the digestive system complicating pregnancy, second trimester: Secondary | ICD-10-CM

## 2017-12-06 MED ORDER — PANTOPRAZOLE SODIUM 40 MG PO TBEC: 40.0000 mg | DELAYED_RELEASE_TABLET | Freq: Every day | ORAL | 6 refills | Status: DC

## 2017-12-06 MED ORDER — FAMOTIDINE 20 MG PO TABS: 20.0000 mg | ORAL_TABLET | Freq: Two times a day (BID) | ORAL | 3 refills | Status: DC

## 2017-12-06 MED ORDER — VITATRUE 30-1.4 & 300 MG PO MISC: 1.0000 | Freq: Every day | ORAL | 12 refills | Status: DC

## 2017-12-06 NOTE — Progress Notes (Signed)
   PRENATAL VISIT NOTE  Subjective:  Dorothy Haas Dorothy Haas is a 27 y.o. G3P2002 at 3444w1d being seen today for ongoing prenatal care.  She is currently monitored for the following issues for this low-risk pregnancy and has Supervision of other normal pregnancy, antepartum; Previous cesarean section x 2; Female circumcision; Transaminitis; Nausea and vomiting of pregnancy, antepartum; Nausea and vomiting during pregnancy prior to [redacted] weeks gestation; Hypokalemia; Hypomagnesemia; ?Hyperthyroidism; Ptyalism; and Acid reflux on their problem list.  Patient reports heartburn. Pepcid not working. Contractions: Not present. Vag. Bleeding: None.  Movement: Absent. Denies leaking of fluid.   The following portions of the patient's history were reviewed and updated as appropriate: allergies, current medications, past family history, past medical history, past social history, past surgical history and problem list. Problem list updated.  Live interpreter used.  Objective:   Vitals:   12/06/17 0905  BP: 119/67  Pulse: 92  Weight: 103 lb 11.2 oz (47 kg)    Fetal Status: Fetal Heart Rate (bpm): 145   Movement: Absent     General:  Alert, oriented and cooperative. Patient is in no acute distress.  Skin: Skin is warm and dry. No rash noted.   Cardiovascular: Normal heart rate noted  Respiratory: Normal respiratory effort, no problems with respiration noted  Abdomen: Soft, gravid, appropriate for gestational age.  Pain/Pressure: Present     Pelvic: Cervical exam deferred        Extremities: Normal range of motion.  Edema: None  Mental Status: Normal mood and affect. Normal behavior. Normal judgment and thought content.   Assessment and Plan:  Pregnancy: G3P2002 at 10244w1d  1. Supervision of other normal pregnancy, antepartum - Anatomy US tomorrow - Flu shot  2. Previous cesarean section x 2 - Plan repeat  3. Hyperthyroidism affecting pregnancy in second trimester  - TSH - T4, free -  T3  4. Heartburn  - Change to Protonix.  - Dietary chnages  Preterm labor symptoms and general obstetric precautions including but not limited to vaginal bleeding, contractions, leaking of fluid and fetal movement were reviewed in detail with the patient. Please refer to After Visit Summary for other counseling recommendations.  Return in about 4 weeks (around 01/03/2018).  Future Appointments  Date Time Provider Department Center  12/07/2017 10:15 AM WH-M[facility] US 4 WH-MFCUS MFC-US    [place]othy [place], [place]

## 2017-12-06 NOTE — Progress Notes (Signed)
1 

## 2017-12-06 NOTE — Patient Instructions (Signed)
Second Trimester of Pregnancy The second trimester is from week 13 through week 28, month 4 through 6. This is often the time in pregnancy that you feel your best. Often times, morning sickness has lessened or quit. You may have more energy, and you may get hungry more often. Your unborn baby (fetus) is growing rapidly. At the end of the sixth month, he or she is about 9 inches long and weighs about 1 pounds. You will likely feel the baby move (quickening) between 18 and 20 weeks of pregnancy. Follow these instructions at home:  Avoid all smoking, herbs, and alcohol. Avoid drugs not approved by your doctor.  Do not use any tobacco products, including cigarettes, chewing tobacco, and electronic cigarettes. If you need help quitting, ask your doctor. You may get counseling or other support to help you quit.  Only take medicine as told by your doctor. Some medicines are safe and some are not during pregnancy.  Exercise only as told by your doctor. Stop exercising if you start having cramps.  Eat regular, healthy meals.  Wear a good support bra if your breasts are tender.  Do not use hot tubs, steam rooms, or saunas.  Wear your seat belt when driving.  Avoid raw meat, uncooked cheese, and liter boxes and soil used by cats.  Take your prenatal vitamins.  Take 1500-2000 milligrams of calcium daily starting at the 20th week of pregnancy until you deliver your baby.  Try taking medicine that helps you poop (stool softener) as needed, and if your doctor approves. Eat more fiber by eating fresh fruit, vegetables, and whole grains. Drink enough fluids to keep your pee (urine) clear or pale yellow.  Take warm water baths (sitz baths) to soothe pain or discomfort caused by hemorrhoids. Use hemorrhoid cream if your doctor approves.  If you have puffy, bulging veins (varicose veins), wear support hose. Raise (elevate) your feet for 15 minutes, 3-4 times a day. Limit salt in your diet.  Avoid heavy  lifting, wear low heals, and sit up straight.  Rest with your legs raised if you have leg cramps or low back pain.  Visit your dentist if you have not gone during your pregnancy. Use a soft toothbrush to brush your teeth. Be gentle when you floss.  You can have sex (intercourse) unless your doctor tells you not to.  Go to your doctor visits. Get help if:  You feel dizzy.  You have mild cramps or pressure in your lower belly (abdomen).  You have a nagging pain in your belly area.  You continue to feel sick to your stomach (nauseous), throw up (vomit), or have watery poop (diarrhea).  You have bad smelling fluid coming from your vagina.  You have pain with peeing (urination). Get help right away if:  You have a fever.  You are leaking fluid from your vagina.  You have spotting or bleeding from your vagina.  You have severe belly cramping or pain.  You lose or gain weight rapidly.  You have trouble catching your breath and have chest pain.  You notice sudden or extreme puffiness (swelling) of your face, hands, ankles, feet, or legs.  You have not felt the baby move in over an hour.  You have severe headaches that do not go away with medicine.  You have vision changes. This information is not intended to replace advice given to you by your health care provider. Make sure you discuss any questions you have with your health care   provider. Document Released: 03/24/2009 Document Revised: 06/05/2015 Document Reviewed: 02/29/2012 Elsevier Interactive Patient Education  2017 Elsevier Inc.  

## 2017-12-07 ENCOUNTER — Ambulatory Visit (HOSPITAL_COMMUNITY)
Admission: RE | Admit: 2017-12-07 | Discharge: 2017-12-07 | Disposition: A | Discharge status: Home / Self Care | Payer: Medicaid Other | Source: Ambulatory Visit | Attending: Advanced Practice Midwife | Admitting: Advanced Practice Midwife

## 2017-12-07 DIAGNOSIS — O34219 Maternal care for unspecified type scar from previous cesarean delivery: Secondary | ICD-10-CM

## 2017-12-07 DIAGNOSIS — Z363 Encounter for antenatal screening for malformations: Secondary | ICD-10-CM

## 2017-12-07 DIAGNOSIS — Z3A18 18 weeks gestation of pregnancy: Secondary | ICD-10-CM | POA: Diagnosis not present

## 2017-12-07 DIAGNOSIS — Z3480 Encounter for supervision of other normal pregnancy, unspecified trimester: Secondary | ICD-10-CM

## 2017-12-07 DIAGNOSIS — Z3482 Encounter for supervision of other normal pregnancy, second trimester: Secondary | ICD-10-CM | POA: Diagnosis present

## 2017-12-08 LAB — AFP, SERUM, OPEN SPINA BIFIDA
AFP MoM: 0.78
AFP Value: 44.7 ng/mL
GEST. AGE ON COLLECTION DATE: 18.1 wk
MATERNAL AGE AT EDD: 27.3 a
OSBR Risk 1 IN: 10000
TEST RESULTS AFP: NEGATIVE
Weight: 103 [lb_av]

## 2017-12-08 LAB — TSH

## 2017-12-08 LAB — T4, FREE: FREE T4: 0.86 ng/dL (ref 0.82–1.77)

## 2017-12-08 LAB — T3: T3 TOTAL: 137 ng/dL (ref 71–180)

## 2018-01-05 ENCOUNTER — Ambulatory Visit (INDEPENDENT_AMBULATORY_CARE_PROVIDER_SITE_OTHER): Payer: Medicaid Other | Admitting: Student

## 2018-01-05 DIAGNOSIS — K219 Gastro-esophageal reflux disease without esophagitis: Secondary | ICD-10-CM

## 2018-01-05 DIAGNOSIS — O99612 Diseases of the digestive system complicating pregnancy, second trimester: Secondary | ICD-10-CM

## 2018-01-05 DIAGNOSIS — E0590 Thyrotoxicosis, unspecified without thyrotoxic crisis or storm: Secondary | ICD-10-CM

## 2018-01-05 DIAGNOSIS — Z3480 Encounter for supervision of other normal pregnancy, unspecified trimester: Secondary | ICD-10-CM

## 2018-01-05 DIAGNOSIS — O99282 Endocrine, nutritional and metabolic diseases complicating pregnancy, second trimester: Secondary | ICD-10-CM

## 2018-01-05 MED ORDER — SUCRALFATE 1 GM/10ML PO SUSP: 1.0000 g | Freq: Three times a day (TID) | ORAL | 3 refills | Status: DC

## 2018-01-05 MED ORDER — FAMOTIDINE 20 MG PO TABS: 20.0000 mg | ORAL_TABLET | Freq: Two times a day (BID) | ORAL | 3 refills | Status: DC

## 2018-01-05 NOTE — Patient Instructions (Signed)
Gastroesophageal Reflux Disease, Adult Gastroesophageal reflux (GER) happens when acid from the stomach flows up into the tube that connects the mouth and the stomach (esophagus). Normally, food travels down the esophagus and stays in the stomach to be digested. However, when a person has GER, food and stomach acid sometimes move back up into the esophagus. If this becomes a more serious problem, the person may be diagnosed with a disease called gastroesophageal reflux disease (GERD). GERD occurs when the reflux:  Happens often.  Causes frequent or severe symptoms.  Causes problems such as damage to the esophagus. When stomach acid comes in contact with the esophagus, the acid may cause soreness (inflammation) in the esophagus. Over time, GERD may create small holes (ulcers) in the lining of the esophagus. What are the causes? This condition is caused by a problem with the muscle between the esophagus and the stomach (lower esophageal sphincter, or LES). Normally, the LES muscle closes after food passes through the esophagus to the stomach. When the LES is weakened or abnormal, it does not close properly, and that allows food and stomach acid to go back up into the esophagus. The LES can be weakened by certain dietary substances, medicines, and medical conditions, including:  Tobacco use.  Pregnancy.  Having a hiatal hernia.  Alcohol use.  Certain foods and beverages, such as coffee, chocolate, onions, and peppermint. What increases the risk? You are more likely to develop this condition if you:  Have an increased body weight.  Have a connective tissue disorder.  Use NSAID medicines. What are the signs or symptoms? Symptoms of this condition include:  Heartburn.  Difficult or painful swallowing.  The feeling of having a lump in the throat.  Abitter taste in the mouth.  Bad breath.  Having a large amount of saliva.  Having an upset or bloated  stomach.  Belching.  Chest pain. Different conditions can cause chest pain. Make sure you see your health care provider if you experience chest pain.  Shortness of breath or wheezing.  Ongoing (chronic) cough or a night-time cough.  Wearing away of tooth enamel.  Weight loss. How is this diagnosed? Your health care provider will take a medical history and perform a physical exam. To determine if you have mild or severe GERD, your health care provider may also monitor how you respond to treatment. You may also have tests, including:  A test to examine your stomach and esophagus with a small camera (endoscopy).  A test thatmeasures the acidity level in your esophagus.  A test thatmeasures how much pressure is on your esophagus.  A barium swallow or modified barium swallow test to show the shape, size, and functioning of your esophagus. How is this treated? The goal of treatment is to help relieve your symptoms and to prevent complications. Treatment for this condition may vary depending on how severe your symptoms are. Your health care provider may recommend:  Changes to your diet.  Medicine.  Surgery. Follow these instructions at home: Eating and drinking   Follow a diet as recommended by your health care provider. This may involve avoiding foods and drinks such as: ? Coffee and tea (with or without caffeine). ? Drinks that containalcohol. ? Energy drinks and sports drinks. ? Carbonated drinks or sodas. ? Chocolate and cocoa. ? Peppermint and mint flavorings. ? Garlic and onions. ? Horseradish. ? Spicy and acidic foods, including peppers, chili powder, curry powder, vinegar, hot sauces, and barbecue sauce. ? Citrus fruit juices and citrus   fruits, such as oranges, lemons, and limes. ? Tomato-based foods, such as red sauce, chili, salsa, and pizza with red sauce. ? Fried and fatty foods, such as donuts, french fries, potato chips, and high-fat dressings. ? High-fat  meats, such as hot dogs and fatty cuts of red and white meats, such as rib eye steak, sausage, ham, and bacon. ? High-fat dairy items, such as whole milk, butter, and cream cheese.  Eat small, frequent meals instead of large meals.  Avoid drinking large amounts of liquid with your meals.  Avoid eating meals during the 2-3 hours before bedtime.  Avoid lying down right after you eat.  Do not exercise right after you eat. Lifestyle   Do not use any products that contain nicotine or tobacco, such as cigarettes, e-cigarettes, and chewing tobacco. If you need help quitting, ask your health care provider.  Try to reduce your stress by using methods such as yoga or meditation. If you need help reducing stress, ask your health care provider.  If you are overweight, reduce your weight to an amount that is healthy for you. Ask your health care provider for guidance about a safe weight loss goal. General instructions  Pay attention to any changes in your symptoms.  Take over-the-counter and prescription medicines only as told by your health care provider. Do not take aspirin, ibuprofen, or other NSAIDs unless your health care provider told you to do so.  Wear loose-fitting clothing. Do not wear anything tight around your waist that causes pressure on your abdomen.  Raise (elevate) the head of your bed about 6 inches (15 cm).  Avoid bending over if this makes your symptoms worse.  Keep all follow-up visits as told by your health care provider. This is important. Contact a health care provider if:  You have: ? New symptoms. ? Unexplained weight loss. ? Difficulty swallowing or it hurts to swallow. ? Wheezing or a persistent cough. ? A hoarse voice.  Your symptoms do not improve with treatment. Get help right away if you:  Have pain in your arms, neck, jaw, teeth, or back.  Feel sweaty, dizzy, or light-headed.  Have chest pain or shortness of breath.  Vomit and your vomit looks  like blood or coffee grounds.  Faint.  Have stool that is bloody or black.  Cannot swallow, drink, or eat. Summary  Gastroesophageal reflux happens when acid from the stomach flows up into the esophagus. GERD is a disease in which the reflux happens often, causes frequent or severe symptoms, or causes problems such as damage to the esophagus.  Treatment for this condition may vary depending on how severe your symptoms are. Your health care provider may recommend diet and lifestyle changes, medicine, or surgery.  Contact a health care provider if you have new or worsening symptoms.  Take over-the-counter and prescription medicines only as told by your health care provider. Do not take aspirin, ibuprofen, or other NSAIDs unless your health care provider told you to do so.  Keep all follow-up visits as told by your health care provider. This is important. This information is not intended to replace advice given to you by your health care provider. Make sure you discuss any questions you have with your health care provider. Document Released: 10/07/2004 Document Revised: 07/06/2017 Document Reviewed: 07/06/2017 Elsevier Interactive Patient Education  2019 Elsevier Inc.  

## 2018-01-05 NOTE — Progress Notes (Signed)
Patient ID: Dorothy Haas, female   DOB: 04/11/1990, 27 y.o.   MRN: [id number]   PRENATAL VISIT NOTE  Subjective:  Dorothy Haas is a 27 y.o. G3P2002 at 8159w3d being seen today for ongoing prenatal care.  She is currently monitored for the following issues for this low-risk pregnancy and has Supervision of other normal pregnancy, antepartum; Previous cesarean section x 2; Female circumcision; Transaminitis; Nausea and vomiting of pregnancy, antepartum; Nausea and vomiting during pregnancy prior to [redacted] weeks gestation; Hypokalemia; Hypomagnesemia; ?Hyperthyroidism; Ptyalism; and Acid reflux on their problem list.  Patient reports some acid reflux when she doesn't take her medicine. She only throws up in the morning. .  Contractions: Not present. Vag. Bleeding: None.  Movement: Present. Denies leaking of fluid.   The following portions of the patient's history were reviewed and updated as appropriate: allergies, current medications, past family history, past medical history, past social history, past surgical history and problem list. Problem list updated.  Objective:   Vitals:   01/05/18 1606  BP: 102/66  Pulse: 89    Fetal Status: Fetal Heart Rate (bpm): 161 Fundal Height: 22 cm Movement: Present     General:  Alert, oriented and cooperative. Patient is in no acute distress.  Skin: Skin is warm and dry. No rash noted.   Cardiovascular: Normal heart rate noted  Respiratory: Normal respiratory effort, no problems with respiration noted  Abdomen: Soft, gravid, appropriate for gestational age.  Pain/Pressure: Present     Pelvic: Cervical exam deferred        Extremities: Normal range of motion.  Edema: None  Mental Status: Normal mood and affect. Normal behavior. Normal judgment and thought content.   Assessment and Plan:  Pregnancy: G3P2002 at 4259w3d  1. Hyperthyroidism affecting pregnancy in second trimester -no S/S today; will check TFTs at 26 weeks.   2.  Supervision of other normal pregnancy, antepartum -will send refill for carafate and pepcid.  -desires repeat C-section  Preterm labor symptoms and general obstetric precautions including but not limited to vaginal bleeding, contractions, leaking of fluid and fetal movement were reviewed in detail with the patient. Please refer to After Visit Summary for other counseling recommendations.  No follow-ups on file.  No future appointments.  Marylene LandKathryn Lorraine , CNM

## 2018-01-05 NOTE — Progress Notes (Signed)
Video Interpreter [phone]

## 2018-01-09 ENCOUNTER — Telehealth: Payer: Self-pay | Admitting: *Deleted

## 2018-01-09 MED ORDER — SUCRALFATE 1 GM/10ML PO SUSP: 1.0000 g | Freq: Three times a day (TID) | ORAL | 3 refills | Status: DC

## 2018-01-09 NOTE — Telephone Encounter (Signed)
[facility] requesting a new Rx for carafate to be sent. Their system has encountered some problems and they have lost the image of Rx. Without the image they cannot fill the Rx. New Rx e-prescribed as requested.

## 2018-02-02 ENCOUNTER — Other Ambulatory Visit: Payer: Self-pay

## 2018-02-02 ENCOUNTER — Ambulatory Visit (INDEPENDENT_AMBULATORY_CARE_PROVIDER_SITE_OTHER): Payer: Medicaid Other | Admitting: Obstetrics and Gynecology

## 2018-02-02 VITALS — BP 108/72 | HR 99 | Wt 113.6 lb

## 2018-02-02 DIAGNOSIS — O219 Vomiting of pregnancy, unspecified: Secondary | ICD-10-CM | POA: Diagnosis not present

## 2018-02-02 DIAGNOSIS — Z3482 Encounter for supervision of other normal pregnancy, second trimester: Secondary | ICD-10-CM | POA: Diagnosis not present

## 2018-02-02 DIAGNOSIS — E0590 Thyrotoxicosis, unspecified without thyrotoxic crisis or storm: Secondary | ICD-10-CM | POA: Diagnosis not present

## 2018-02-02 DIAGNOSIS — O99282 Endocrine, nutritional and metabolic diseases complicating pregnancy, second trimester: Secondary | ICD-10-CM

## 2018-02-02 DIAGNOSIS — Z3480 Encounter for supervision of other normal pregnancy, unspecified trimester: Secondary | ICD-10-CM

## 2018-02-02 DIAGNOSIS — K219 Gastro-esophageal reflux disease without esophagitis: Secondary | ICD-10-CM | POA: Insufficient documentation

## 2018-02-02 DIAGNOSIS — Z8719 Personal history of other diseases of the digestive system: Secondary | ICD-10-CM

## 2018-02-02 DIAGNOSIS — R000 Tachycardia, unspecified: Secondary | ICD-10-CM

## 2018-02-02 HISTORY — DX: Tachycardia, unspecified: R00.0

## 2018-02-02 HISTORY — DX: Gastro-esophageal reflux disease without esophagitis: K21.9

## 2018-02-02 HISTORY — DX: Personal history of other diseases of the digestive system: Z87.19

## 2018-02-02 LAB — POCT URINALYSIS DIP (DEVICE)
Bilirubin Urine: NEGATIVE
Glucose, UA: NEGATIVE mg/dL
HGB URINE DIPSTICK: NEGATIVE
KETONES UR: NEGATIVE mg/dL
Leukocytes, UA: NEGATIVE
Nitrite: NEGATIVE
Protein, ur: NEGATIVE mg/dL
SPECIFIC GRAVITY, URINE: 1.025 (ref 1.005–1.030)
UROBILINOGEN UA: 0.2 mg/dL (ref 0.0–1.0)
pH: 6.5 (ref 5.0–8.0)

## 2018-02-02 MED ORDER — PANTOPRAZOLE SODIUM 40 MG PO TBEC: 40.0000 mg | DELAYED_RELEASE_TABLET | Freq: Every day | ORAL | 1 refills | Status: DC

## 2018-02-02 NOTE — Progress Notes (Signed)
   PRENATAL VISIT NOTE  Subjective:  Dorothy Haas is a 28 y.o. G3P2002 at [redacted]w[redacted]d being seen today for ongoing prenatal care.  She is currently monitored for the following issues for this low-risk pregnancy and has Supervision of other normal pregnancy, antepartum; Previous cesarean section x 2; Female circumcision; Transaminitis; Nausea and vomiting during pregnancy prior to [redacted] weeks gestation; Hypokalemia; Hypomagnesemia; ?Hyperthyroidism; Ptyalism; GERD (gastroesophageal reflux disease); and Heart rate fast on their problem list.  Patient reports heartburn and feeling that her heart is racing. Anytime she is doing activities. When she lays down it goes away. She had these same symptoms with her other pregnancies.   Contractions: Not present. Vag. Bleeding: None.  Movement: PresentDenies leaking of fluid.   The following portions of the patient's history were reviewed and updated as appropriate: allergies, current medications, past family history, past medical history, past social history, past surgical history and problem list. Problem list updated.  Objective:   Vitals:   02/02/18 1319  BP: 108/72  Pulse: 99  Weight: 113 lb 9.6 oz (51.5 kg)    Fetal Status: Fetal Heart Rate (bpm): 154   Movement: Present     General:  Alert, oriented and cooperative. Patient is in no acute distress.  Skin: Skin is warm and dry. No rash noted.   Cardiovascular: Normal heart rate noted  Respiratory: Normal respiratory effort, no problems with respiration noted  Abdomen: Soft, gravid, appropriate for gestational age.  Pain/Pressure: Present     Pelvic: Cervical exam deferred        Extremities: Normal range of motion.  Edema: None  Mental Status: Normal mood and affect. Normal behavior. Normal judgment and thought content.   Assessment and Plan:  Pregnancy: G3P2002 at [redacted]w[redacted]d  1. Supervision of other normal pregnancy, antepartum  - repeat C-section consent signed today.   2.  Hyperthyroidism affecting pregnancy in second trimester  - Thyroid Panel With TSH  3. Racing heart beat  - Electrocardiogram report - Ambulatory referral to cardiology.  - CBC, CMP, Magnesium  - UA - EKG NSR today - Discussed with Dr. Alysia Penna, could consider beta blocker, will see what cardiology says.   4. Gastroesophageal reflux disease, esophagitis presence not specified  Rx: Protonix   5. Nausea and vomiting during pregnancy prior to [redacted] weeks gestation  -Vomits one time every day.    Preterm labor symptoms and general obstetric precautions including but not limited to vaginal bleeding, contractions, leaking of fluid and fetal movement were reviewed in detail with the patient. Please refer to After Visit Summary for other counseling recommendations.  No follow-ups on file.  Future Appointments  Date Time Provider Department Center  03/03/2018  8:20 AM [facility]  03/03/2018  8:55 AM Gwenevere Abbot, MD [facility]    Venia Carbon, NP

## 2018-02-02 NOTE — Progress Notes (Signed)
Heartburn medication not working, hard to eat, like a burping sensation and feels in throat. Has tried Pepcid and Protonix.  Heart racing while up moving around had to lay down for a while to feel better getting worse throughout the day.

## 2018-02-03 LAB — COMPREHENSIVE METABOLIC PANEL
ALK PHOS: 75 IU/L (ref 39–117)
ALT: 5 IU/L (ref 0–32)
AST: 15 IU/L (ref 0–40)
Albumin/Globulin Ratio: 1.2 (ref 1.2–2.2)
Albumin: 3.3 g/dL — ABNORMAL LOW (ref 3.9–5.0)
BUN/Creatinine Ratio: 18 (ref 9–23)
BUN: 7 mg/dL (ref 6–20)
Bilirubin Total: <0.2 mg/dL (ref 0.0–1.2)
CALCIUM: 8.7 mg/dL (ref 8.7–10.2)
CO2: 18 mmol/L — AB (ref 20–29)
CREATININE: 0.38 mg/dL — AB (ref 0.57–1.00)
Chloride: 102 mmol/L (ref 96–106)
GFR calc Af Amer: 168 mL/min/{1.73_m2} (ref >59)
GFR, EST NON AFRICAN AMERICAN: 146 mL/min/{1.73_m2} (ref >59)
GLOBULIN, TOTAL: 2.8 g/dL (ref 1.5–4.5)
GLUCOSE: 85 mg/dL (ref 65–99)
Potassium: 3.8 mmol/L (ref 3.5–5.2)
Sodium: 137 mmol/L (ref 134–144)
TOTAL PROTEIN: 6.1 g/dL (ref 6.0–8.5)

## 2018-02-03 LAB — MAGNESIUM: MAGNESIUM: 1.7 mg/dL (ref 1.6–2.3)

## 2018-02-03 LAB — THYROID PANEL WITH TSH
FREE THYROXINE INDEX: 0.8 — AB (ref 1.2–4.9)
T3 UPTAKE RATIO: 15 % — AB (ref 24–39)
T4, Total: 5.6 ug/dL (ref 4.5–12.0)
TSH: 1.09 u[IU]/mL (ref 0.450–4.500)

## 2018-02-03 LAB — CBC
HEMATOCRIT: 27.9 % — AB (ref 34.0–46.6)
HEMOGLOBIN: 9.1 g/dL — AB (ref 11.1–15.9)
MCH: 27.6 pg (ref 26.6–33.0)
MCHC: 32.6 g/dL (ref 31.5–35.7)
MCV: 85 fL (ref 79–97)
Platelets: 168 10*3/uL (ref 150–450)
RBC: 3.3 x10E6/uL — AB (ref 3.77–5.28)
RDW: 12.5 % (ref 11.7–15.4)
WBC: 6.9 10*3/uL (ref 3.4–10.8)

## 2018-02-11 ENCOUNTER — Encounter: Payer: Self-pay | Admitting: Obstetrics and Gynecology

## 2018-02-11 DIAGNOSIS — O99019 Anemia complicating pregnancy, unspecified trimester: Secondary | ICD-10-CM | POA: Insufficient documentation

## 2018-02-11 HISTORY — DX: Anemia complicating pregnancy, unspecified trimester: O99.019

## 2018-03-03 ENCOUNTER — Encounter: Payer: Medicaid Other | Admitting: Family Medicine

## 2018-03-03 ENCOUNTER — Telehealth: Payer: Self-pay | Admitting: Family Medicine

## 2018-03-03 ENCOUNTER — Other Ambulatory Visit: Payer: Medicaid Other

## 2018-03-03 NOTE — Telephone Encounter (Signed)
Called patient to get the appt rsch the Voicemail Box is not set up, Appt reminder letter was sent with the new appt, The office was closed due to the weather

## 2018-03-15 ENCOUNTER — Other Ambulatory Visit: Payer: Self-pay | Admitting: *Deleted

## 2018-03-15 ENCOUNTER — Encounter: Payer: Self-pay | Admitting: Obstetrics & Gynecology

## 2018-03-15 ENCOUNTER — Ambulatory Visit (INDEPENDENT_AMBULATORY_CARE_PROVIDER_SITE_OTHER): Payer: Medicaid Other | Admitting: Obstetrics & Gynecology

## 2018-03-15 ENCOUNTER — Other Ambulatory Visit: Payer: Medicaid Other

## 2018-03-15 VITALS — BP 112/74 | HR 96 | Wt 120.3 lb

## 2018-03-15 DIAGNOSIS — Z3480 Encounter for supervision of other normal pregnancy, unspecified trimester: Secondary | ICD-10-CM

## 2018-03-15 DIAGNOSIS — O99013 Anemia complicating pregnancy, third trimester: Secondary | ICD-10-CM

## 2018-03-15 DIAGNOSIS — Z98891 History of uterine scar from previous surgery: Secondary | ICD-10-CM

## 2018-03-15 DIAGNOSIS — Z23 Encounter for immunization: Secondary | ICD-10-CM

## 2018-03-15 DIAGNOSIS — R000 Tachycardia, unspecified: Secondary | ICD-10-CM

## 2018-03-15 DIAGNOSIS — Z3A32 32 weeks gestation of pregnancy: Secondary | ICD-10-CM

## 2018-03-15 NOTE — Progress Notes (Signed)
   PRENATAL VISIT NOTE  Subjective:  Dorothy Haas is a 28 y.o. G3P2002 at [redacted]w[redacted]d being seen today for ongoing prenatal care.  She is currently monitored for the following issues for this high-risk pregnancy and has Supervision of other normal pregnancy, antepartum; Previous cesarean section x 2; Female circumcision; Transaminitis; Nausea and vomiting during pregnancy prior to [redacted] weeks gestation; Hypokalemia; Hypomagnesemia; ?Hyperthyroidism; Ptyalism; GERD (gastroesophageal reflux disease); Racing heart beat; and Anemia affecting pregnancy on their problem list.  Patient reports she is very worried about her racing heart beat.  Contractions: Not present. Vag. Bleeding: None.  Movement: Present. Denies leaking of fluid.   The following portions of the patient's history were reviewed and updated as appropriate: allergies, current medications, past family history, past medical history, past social history, past surgical history and problem list. Problem list updated.  Objective:   Vitals:   03/15/18 0917  BP: 112/74  Pulse: 96  Weight: 120 lb 4.8 oz (54.6 kg)    Fetal Status: Fetal Heart Rate (bpm): 152   Movement: Present     General:  Alert, oriented and cooperative. Patient is in no acute distress.  Skin: Skin is warm and dry. No rash noted.   Cardiovascular: Normal heart rate noted  Respiratory: Normal respiratory effort, no problems with respiration noted  Abdomen: Soft, gravid, appropriate for gestational age.  Pain/Pressure: Present     Pelvic: Cervical exam deferred        Extremities: Normal range of motion.  Edema: None  Mental Status: Normal mood and affect. Normal behavior. Normal judgment and thought content.   Assessment and Plan:  Pregnancy: G3P2002 at [redacted]w[redacted]d  1. Supervision of other normal pregnancy, antepartum - 28 week labs, TDAP  2. Previous cesarean section x 2 - RLTCS, consent signed  3. Anemia affecting pregnancy in third trimester - ferreheme x  2 ordered  4. Racing heart beat - normal TSH - cards referral  Preterm labor symptoms and general obstetric precautions including but not limited to vaginal bleeding, contractions, leaking of fluid and fetal movement were reviewed in detail with the patient. Please refer to After Visit Summary for other counseling recommendations.  Return in about 2 weeks (around 03/29/2018).  Future Appointments  Date Time Provider Department Center  03/15/2018  9:30 AM [facility]    Allie Bossier, MD

## 2018-03-15 NOTE — Progress Notes (Signed)
Scheduled Cardiology appt for [facility] [phone] for April 9th 1430.   Faraheme scheduled for March 9th.  Pt notified.

## 2018-03-16 ENCOUNTER — Other Ambulatory Visit: Payer: Self-pay | Admitting: Obstetrics & Gynecology

## 2018-03-16 LAB — CBC
HEMATOCRIT: 26 % — AB (ref 34.0–46.6)
Hemoglobin: 8.3 g/dL — ABNORMAL LOW (ref 11.1–15.9)
MCH: 25.5 pg — ABNORMAL LOW (ref 26.6–33.0)
MCHC: 31.9 g/dL (ref 31.5–35.7)
MCV: 80 fL (ref 79–97)
Platelets: 130 10*3/uL — ABNORMAL LOW (ref 150–450)
RBC: 3.25 x10E6/uL — ABNORMAL LOW (ref 3.77–5.28)
RDW: 13.4 % (ref 11.7–15.4)
WBC: 5.4 10*3/uL (ref 3.4–10.8)

## 2018-03-16 LAB — HIV ANTIBODY (ROUTINE TESTING W REFLEX): HIV SCREEN 4TH GENERATION: NONREACTIVE

## 2018-03-16 LAB — GLUCOSE TOLERANCE, 2 HOURS W/ 1HR
GLUCOSE, FASTING: 79 mg/dL (ref 65–91)
Glucose, 1 hour: 148 mg/dL (ref 65–179)
Glucose, 2 hour: 115 mg/dL (ref 65–152)

## 2018-03-16 LAB — RPR: RPR Ser Ql: NONREACTIVE

## 2018-03-16 NOTE — Progress Notes (Unsigned)
fereheme x 2 doses ordered for anemia 

## 2018-03-20 ENCOUNTER — Ambulatory Visit (HOSPITAL_COMMUNITY)
Admission: RE | Admit: 2018-03-20 | Discharge: 2018-03-20 | Disposition: A | Discharge status: Home / Self Care | Payer: Medicaid Other | Source: Ambulatory Visit | Attending: Obstetrics & Gynecology | Admitting: Obstetrics & Gynecology

## 2018-03-20 ENCOUNTER — Other Ambulatory Visit: Payer: Self-pay

## 2018-03-20 DIAGNOSIS — D649 Anemia, unspecified: Secondary | ICD-10-CM | POA: Diagnosis not present

## 2018-03-20 MED ORDER — SODIUM CHLORIDE 0.9 % IV SOLN
510.0000 mg | INTRAVENOUS | Status: DC
  Administered 2018-03-20: 510 mg via INTRAVENOUS
  Filled 2018-03-20: qty 510

## 2018-03-20 NOTE — Discharge Instructions (Signed)

## 2018-03-27 ENCOUNTER — Other Ambulatory Visit: Payer: Self-pay

## 2018-03-27 ENCOUNTER — Encounter (HOSPITAL_COMMUNITY)
Admission: RE | Admit: 2018-03-27 | Discharge: 2018-03-27 | Disposition: A | Discharge status: Home / Self Care | Payer: Medicaid Other | Source: Ambulatory Visit | Attending: Obstetrics & Gynecology | Admitting: Obstetrics & Gynecology

## 2018-03-27 DIAGNOSIS — O99013 Anemia complicating pregnancy, third trimester: Secondary | ICD-10-CM | POA: Insufficient documentation

## 2018-03-27 MED ORDER — SODIUM CHLORIDE 0.9 % IV SOLN
510.0000 mg | INTRAVENOUS | Status: DC
  Administered 2018-03-27: 510 mg via INTRAVENOUS
  Filled 2018-03-27: qty 510

## 2018-04-03 ENCOUNTER — Ambulatory Visit (INDEPENDENT_AMBULATORY_CARE_PROVIDER_SITE_OTHER): Payer: Medicaid Other | Admitting: Advanced Practice Midwife

## 2018-04-03 ENCOUNTER — Other Ambulatory Visit: Payer: Self-pay

## 2018-04-03 ENCOUNTER — Encounter: Payer: Self-pay | Admitting: Advanced Practice Midwife

## 2018-04-03 VITALS — BP 100/71 | HR 78 | Temp 97.8°F | Wt 121.1 lb

## 2018-04-03 DIAGNOSIS — Z3483 Encounter for supervision of other normal pregnancy, third trimester: Secondary | ICD-10-CM

## 2018-04-03 DIAGNOSIS — Z3480 Encounter for supervision of other normal pregnancy, unspecified trimester: Secondary | ICD-10-CM

## 2018-04-03 DIAGNOSIS — Z3A35 35 weeks gestation of pregnancy: Secondary | ICD-10-CM

## 2018-04-03 NOTE — Progress Notes (Signed)
   PRENATAL VISIT NOTE  Subjective:  Dorothy Haas is a 28 y.o. G3P2002 at [redacted]w[redacted]d being seen today for ongoing prenatal care.  She is currently monitored for the following issues for this low-risk pregnancy and has Supervision of other normal pregnancy, antepartum; Previous cesarean section x 2; Female circumcision; Transaminitis; Nausea and vomiting during pregnancy prior to [redacted] weeks gestation; Hypokalemia; Hypomagnesemia; ?Hyperthyroidism; Ptyalism; GERD (gastroesophageal reflux disease); Racing heart beat; and Anemia affecting pregnancy on their problem list.  Patient reports no complaints.  Contractions: Not present. Vag. Bleeding: None.  Movement: Present. Denies leaking of fluid.   The following portions of the patient's history were reviewed and updated as appropriate: allergies, current medications, past family history, past medical history, past social history, past surgical history and problem list.   Objective:   Vitals:   04/03/18 1031  BP: 100/71  Pulse: 78  Temp: 97.8 F (36.6 C)  Weight: 121 lb 1.6 oz (54.9 kg)    Fetal Status: Fetal Heart Rate (bpm): 144 Fundal Height: 35 cm Movement: Present     General:  Alert, oriented and cooperative. Patient is in no acute distress.  Skin: Skin is warm and dry. No rash noted.   Cardiovascular: Normal heart rate noted  Respiratory: Normal respiratory effort, no problems with respiration noted  Abdomen: Soft, gravid, appropriate for gestational age.  Pain/Pressure: Present     Pelvic: Cervical exam deferred        Extremities: Normal range of motion.  Edema: None  Mental Status: Normal mood and affect. Normal behavior. Normal judgment and thought content.   Assessment and Plan:  Pregnancy: G3P2002 at [redacted]w[redacted]d 1. Supervision of other normal pregnancy, antepartum -  CBC at next visit  - Rhea Bleacher messaged to schedule repeat c-section at 39 weeks  Preterm labor symptoms and general obstetric precautions including but not  limited to vaginal bleeding, contractions, leaking of fluid and fetal movement were reviewed in detail with the patient. Please refer to After Visit Summary for other counseling recommendations.   Return in about 2 weeks (around 04/17/2018) for in person visit for GBS collection .  Future Appointments  Date Time Provider Department Center  04/20/2018  2:30 PM Leone Brand, NP [facility]    Thressa Sheller DNP, CNM  04/03/18  10:57 AM

## 2018-04-10 ENCOUNTER — Other Ambulatory Visit: Payer: Self-pay | Admitting: Obstetrics & Gynecology

## 2018-04-10 NOTE — Progress Notes (Signed)
Orders for surgery 

## 2018-04-11 ENCOUNTER — Telehealth: Payer: Self-pay | Admitting: Family Medicine

## 2018-04-11 NOTE — Telephone Encounter (Signed)
Due to COVID19  We are moving appt, I called patient with no answer and not able to leave a message because the mailbox is not set up

## 2018-04-14 NOTE — Pre-Procedure Instructions (Signed)
Interpreter number [phone]

## 2018-04-17 ENCOUNTER — Encounter: Payer: Medicaid Other | Admitting: Obstetrics & Gynecology

## 2018-04-18 ENCOUNTER — Encounter: Payer: Medicaid Other | Admitting: Medical

## 2018-04-18 ENCOUNTER — Telehealth (HOSPITAL_COMMUNITY): Payer: Self-pay | Admitting: *Deleted

## 2018-04-18 NOTE — Patient Instructions (Addendum)
Dorothy Haas  04/26/2018   Your procedure is scheduled on:  05/01/2018  Arrive at 0730 at Entrance C on [address] at [facility]  and [address]. You are invited to use the FREE valet parking or use the Visitor's parking deck.  Pick up the phone at the desk and dial [phone].  Call this number if you have problems the morning of surgery: [phone]  Remember:   Do not eat food:(After Midnight) Desps de medianoche.  Do not drink clear liquids: (After Midnight) Desps de medianoche.  Take these medicines the morning of surgery with A SIP OF WATER:  none   Do not wear jewelry, make-up or nail polish.  Do not wear lotions, powders, or perfumes. Do not wear deodorant.  Do not shave 48 hours prior to surgery.  Do not bring valuables to the hospital.  [facility] is not   responsible for any belongings or valuables brought to the hospital.  Contacts, dentures or bridgework may not be worn into surgery.  Leave suitcase in the car. After surgery it may be brought to your room.  For patients admitted to the hospital, checkout time is 11:00 AM the day of              discharge.      Please read over the following fact sheets that you were given:     Preparing for Surgery

## 2018-04-18 NOTE — Telephone Encounter (Signed)
Preadmission screen Unable to leave message no voice mail.

## 2018-04-20 ENCOUNTER — Telehealth (HOSPITAL_COMMUNITY): Payer: Self-pay | Admitting: *Deleted

## 2018-04-20 ENCOUNTER — Ambulatory Visit: Payer: Medicaid Other | Admitting: Cardiology

## 2018-04-20 NOTE — Telephone Encounter (Signed)
Preadmission screen  

## 2018-04-20 NOTE — Pre-Procedure Instructions (Signed)
Preadmission screen Interpreter number [phone]

## 2018-04-21 ENCOUNTER — Telehealth (HOSPITAL_COMMUNITY): Payer: Self-pay | Admitting: *Deleted

## 2018-04-21 NOTE — Pre-Procedure Instructions (Signed)
Preadmission screen Interpreter number [phone]

## 2018-04-21 NOTE — Telephone Encounter (Signed)
Preadmission screen  

## 2018-04-25 ENCOUNTER — Telehealth (HOSPITAL_COMMUNITY): Payer: Self-pay | Admitting: *Deleted

## 2018-04-25 NOTE — Telephone Encounter (Signed)
Preadmission screen  

## 2018-04-25 NOTE — Pre-Procedure Instructions (Signed)
Interpreter number [phone]

## 2018-04-26 ENCOUNTER — Encounter (HOSPITAL_COMMUNITY): Payer: Self-pay

## 2018-04-26 NOTE — Pre-Procedure Instructions (Signed)
Interpreter number [phone]

## 2018-04-27 ENCOUNTER — Telehealth: Payer: Medicaid Other | Admitting: Interventional Cardiology

## 2018-04-28 ENCOUNTER — Other Ambulatory Visit: Payer: Self-pay | Admitting: Obstetrics & Gynecology

## 2018-04-28 ENCOUNTER — Encounter (HOSPITAL_COMMUNITY)
Admission: RE | Admit: 2018-04-28 | Discharge: 2018-04-28 | Disposition: A | Discharge status: Home / Self Care | Payer: Medicaid Other | Source: Ambulatory Visit

## 2018-04-28 DIAGNOSIS — Z98891 History of uterine scar from previous surgery: Secondary | ICD-10-CM

## 2018-05-01 ENCOUNTER — Inpatient Hospital Stay (HOSPITAL_COMMUNITY): Payer: Medicaid Other | Admitting: Anesthesiology

## 2018-05-01 ENCOUNTER — Encounter (HOSPITAL_COMMUNITY): Payer: Self-pay

## 2018-05-01 ENCOUNTER — Other Ambulatory Visit: Payer: Self-pay

## 2018-05-01 ENCOUNTER — Inpatient Hospital Stay (HOSPITAL_COMMUNITY)
Admission: RE | Admit: 2018-05-01 | Discharge: 2018-05-04 | DRG: 787 | Disposition: A | Discharge status: Home / Self Care | Payer: Medicaid Other | Attending: Obstetrics & Gynecology | Admitting: Obstetrics & Gynecology

## 2018-05-01 ENCOUNTER — Inpatient Hospital Stay (HOSPITAL_COMMUNITY): Admission: AD | Admit: 2018-05-01 | Payer: Medicaid Other | Source: Home / Self Care

## 2018-05-01 ENCOUNTER — Encounter (HOSPITAL_COMMUNITY)
Admission: RE | Disposition: A | Discharge status: Home / Self Care | Payer: Self-pay | Source: Home / Self Care | Attending: Obstetrics & Gynecology

## 2018-05-01 DIAGNOSIS — Z98891 History of uterine scar from previous surgery: Secondary | ICD-10-CM

## 2018-05-01 DIAGNOSIS — O99119 Other diseases of the blood and blood-forming organs and certain disorders involving the immune mechanism complicating pregnancy, unspecified trimester: Secondary | ICD-10-CM

## 2018-05-01 DIAGNOSIS — O34219 Maternal care for unspecified type scar from previous cesarean delivery: Secondary | ICD-10-CM

## 2018-05-01 DIAGNOSIS — O9912 Other diseases of the blood and blood-forming organs and certain disorders involving the immune mechanism complicating childbirth: Secondary | ICD-10-CM | POA: Diagnosis not present

## 2018-05-01 DIAGNOSIS — Z3A39 39 weeks gestation of pregnancy: Secondary | ICD-10-CM | POA: Diagnosis not present

## 2018-05-01 DIAGNOSIS — D6959 Other secondary thrombocytopenia: Secondary | ICD-10-CM | POA: Diagnosis present

## 2018-05-01 DIAGNOSIS — O9962 Diseases of the digestive system complicating childbirth: Secondary | ICD-10-CM | POA: Diagnosis not present

## 2018-05-01 DIAGNOSIS — O34211 Maternal care for low transverse scar from previous cesarean delivery: Principal | ICD-10-CM | POA: Diagnosis present

## 2018-05-01 DIAGNOSIS — D696 Thrombocytopenia, unspecified: Secondary | ICD-10-CM | POA: Diagnosis present

## 2018-05-01 DIAGNOSIS — O99019 Anemia complicating pregnancy, unspecified trimester: Secondary | ICD-10-CM | POA: Diagnosis present

## 2018-05-01 DIAGNOSIS — O9902 Anemia complicating childbirth: Secondary | ICD-10-CM | POA: Diagnosis present

## 2018-05-01 DIAGNOSIS — D649 Anemia, unspecified: Secondary | ICD-10-CM | POA: Diagnosis not present

## 2018-05-01 DIAGNOSIS — K219 Gastro-esophageal reflux disease without esophagitis: Secondary | ICD-10-CM | POA: Diagnosis not present

## 2018-05-01 DIAGNOSIS — Z862 Personal history of diseases of the blood and blood-forming organs and certain disorders involving the immune mechanism: Secondary | ICD-10-CM

## 2018-05-01 DIAGNOSIS — O3429 Maternal care due to uterine scar from other previous surgery: Secondary | ICD-10-CM | POA: Diagnosis present

## 2018-05-01 HISTORY — DX: Thrombocytopenia, unspecified: D69.6

## 2018-05-01 HISTORY — DX: Personal history of diseases of the blood and blood-forming organs and certain disorders involving the immune mechanism: Z86.2

## 2018-05-01 HISTORY — DX: Thrombocytopenia, unspecified: O99.119

## 2018-05-01 LAB — CBC
HCT: 35.6 % — ABNORMAL LOW (ref 36.0–46.0)
Hemoglobin: 11.2 g/dL — ABNORMAL LOW (ref 12.0–15.0)
MCH: 27.7 pg (ref 26.0–34.0)
MCHC: 31.5 g/dL (ref 30.0–36.0)
MCV: 88.1 fL (ref 80.0–100.0)
Platelets: 84 10*3/uL — ABNORMAL LOW (ref 150–400)
RBC: 4.04 MIL/uL (ref 3.87–5.11)
RDW: 20.9 % — ABNORMAL HIGH (ref 11.5–15.5)
WBC: 6 10*3/uL (ref 4.0–10.5)
nRBC: 0 % (ref 0.0–0.2)

## 2018-05-01 LAB — TYPE AND SCREEN
ABO/RH(D): O POS
Antibody Screen: NEGATIVE

## 2018-05-01 LAB — CREATININE, SERUM
Creatinine, Ser: 0.49 mg/dL (ref 0.44–1.00)
GFR calc Af Amer: >60 mL/min (ref >60)
GFR calc non Af Amer: >60 mL/min (ref >60)

## 2018-05-01 LAB — RPR: RPR Ser Ql: NONREACTIVE

## 2018-05-01 SURGERY — Surgical Case
Anesthesia: Spinal

## 2018-05-01 MED ORDER — ZOLPIDEM TARTRATE 5 MG PO TABS: 5.0000 mg | ORAL_TABLET | Freq: Every evening | ORAL | Status: DC | PRN

## 2018-05-01 MED ORDER — OXYCODONE-ACETAMINOPHEN 5-325 MG PO TABS
1.0000 | ORAL_TABLET | ORAL | Status: DC | PRN
  Administered 2018-05-02 – 2018-05-04 (×5): 1 via ORAL
  Filled 2018-05-01 (×6): qty 1

## 2018-05-01 MED ORDER — SCOPOLAMINE 1 MG/3DAYS TD PT72
MEDICATED_PATCH | TRANSDERMAL | Status: AC
  Filled 2018-05-01: qty 1

## 2018-05-01 MED ORDER — SODIUM CHLORIDE 0.9 % IV SOLN
510.0000 mg | Freq: Once | INTRAVENOUS | Status: AC
  Administered 2018-05-01: 510 mg via INTRAVENOUS
  Filled 2018-05-01: qty 17

## 2018-05-01 MED ORDER — MEPERIDINE HCL 25 MG/ML IJ SOLN: 6.2500 mg | INTRAMUSCULAR | Status: DC | PRN

## 2018-05-01 MED ORDER — FENTANYL CITRATE (PF) 100 MCG/2ML IJ SOLN
25.0000 ug | INTRAMUSCULAR | Status: DC | PRN
  Administered 2018-05-01: 25 ug via INTRAVENOUS

## 2018-05-01 MED ORDER — COCONUT OIL OIL: 1.0000 "application " | TOPICAL_OIL | Status: DC | PRN

## 2018-05-01 MED ORDER — OXYTOCIN 40 UNITS IN NORMAL SALINE INFUSION - SIMPLE MED
INTRAVENOUS | Status: AC
  Filled 2018-05-01: qty 1000

## 2018-05-01 MED ORDER — FENTANYL CITRATE (PF) 100 MCG/2ML IJ SOLN
INTRAMUSCULAR | Status: AC
  Filled 2018-05-01: qty 2

## 2018-05-01 MED ORDER — SODIUM CHLORIDE 0.9% FLUSH: 3.0000 mL | INTRAVENOUS | Status: DC | PRN

## 2018-05-01 MED ORDER — LACTATED RINGERS IV SOLN
INTRAVENOUS | Status: DC
  Administered 2018-05-01: 21:00:00 via INTRAVENOUS

## 2018-05-01 MED ORDER — NALBUPHINE HCL 10 MG/ML IJ SOLN: 5.0000 mg | Freq: Once | INTRAMUSCULAR | Status: DC | PRN

## 2018-05-01 MED ORDER — MENTHOL 3 MG MT LOZG: 1.0000 | LOZENGE | OROMUCOSAL | Status: DC | PRN

## 2018-05-01 MED ORDER — ENOXAPARIN SODIUM 40 MG/0.4ML ~~LOC~~ SOLN: 40.0000 mg | SUBCUTANEOUS | Status: DC

## 2018-05-01 MED ORDER — TRANEXAMIC ACID-NACL 1000-0.7 MG/100ML-% IV SOLN
INTRAVENOUS | Status: AC
  Filled 2018-05-01: qty 100

## 2018-05-01 MED ORDER — KETOROLAC TROMETHAMINE 30 MG/ML IJ SOLN: 30.0000 mg | Freq: Four times a day (QID) | INTRAMUSCULAR | Status: DC | PRN

## 2018-05-01 MED ORDER — SODIUM CHLORIDE 0.9 % IV SOLN
INTRAVENOUS | Status: DC | PRN
  Administered 2018-05-01: 10:00:00 40 [IU] via INTRAVENOUS

## 2018-05-01 MED ORDER — SODIUM CHLORIDE 0.45 % IV SOLN
INTRAVENOUS | Status: DC | PRN
  Administered 2018-05-01: 10:00:00 via INTRAVENOUS

## 2018-05-01 MED ORDER — IBUPROFEN 600 MG PO TABS: 600.0000 mg | ORAL_TABLET | Freq: Four times a day (QID) | ORAL | Status: DC | PRN

## 2018-05-01 MED ORDER — MORPHINE SULFATE-NACL 0.5-0.9 MG/ML-% IV SOSY
PREFILLED_SYRINGE | INTRAVENOUS | Status: DC | PRN
  Administered 2018-05-01: .15 mg via EPIDURAL

## 2018-05-01 MED ORDER — FAMOTIDINE 20 MG PO TABS
20.0000 mg | ORAL_TABLET | Freq: Once | ORAL | Status: AC
  Administered 2018-05-01: 09:00:00 20 mg via ORAL

## 2018-05-01 MED ORDER — NALBUPHINE HCL 10 MG/ML IJ SOLN: 5.0000 mg | INTRAMUSCULAR | Status: DC | PRN

## 2018-05-01 MED ORDER — DIBUCAINE (PERIANAL) 1 % EX OINT: 1.0000 "application " | TOPICAL_OINTMENT | CUTANEOUS | Status: DC | PRN

## 2018-05-01 MED ORDER — TETANUS-DIPHTH-ACELL PERTUSSIS 5-2.5-18.5 LF-MCG/0.5 IM SUSP: 0.5000 mL | Freq: Once | INTRAMUSCULAR | Status: DC

## 2018-05-01 MED ORDER — SOD CITRATE-CITRIC ACID 500-334 MG/5ML PO SOLN
30.0000 mL | Freq: Once | ORAL | Status: AC
  Administered 2018-05-01: 30 mL via ORAL

## 2018-05-01 MED ORDER — SENNOSIDES-DOCUSATE SODIUM 8.6-50 MG PO TABS
2.0000 | ORAL_TABLET | ORAL | Status: DC
  Filled 2018-05-01: qty 2

## 2018-05-01 MED ORDER — TRANEXAMIC ACID-NACL 1000-0.7 MG/100ML-% IV SOLN
INTRAVENOUS | Status: DC | PRN
  Administered 2018-05-01: 1000 mg via INTRAVENOUS

## 2018-05-01 MED ORDER — FAMOTIDINE 20 MG PO TABS
ORAL_TABLET | ORAL | Status: AC
  Filled 2018-05-01: qty 1

## 2018-05-01 MED ORDER — OXYTOCIN 40 UNITS IN NORMAL SALINE INFUSION - SIMPLE MED: 2.5000 [IU]/h | INTRAVENOUS | Status: AC

## 2018-05-01 MED ORDER — ACETAMINOPHEN 500 MG PO TABS
1000.0000 mg | ORAL_TABLET | Freq: Four times a day (QID) | ORAL | Status: AC
  Administered 2018-05-01 – 2018-05-02 (×3): 1000 mg via ORAL
  Filled 2018-05-01 (×3): qty 2

## 2018-05-01 MED ORDER — ONDANSETRON HCL 4 MG/2ML IJ SOLN
INTRAMUSCULAR | Status: DC | PRN
  Administered 2018-05-01: 4 mg via INTRAVENOUS

## 2018-05-01 MED ORDER — OXYTOCIN 10 UNIT/ML IJ SOLN
INTRAMUSCULAR | Status: AC
  Filled 2018-05-01: qty 4

## 2018-05-01 MED ORDER — PRENATAL MULTIVITAMIN CH
1.0000 | ORAL_TABLET | Freq: Every day | ORAL | Status: DC
  Administered 2018-05-02 – 2018-05-04 (×3): 1 via ORAL
  Filled 2018-05-01 (×3): qty 1

## 2018-05-01 MED ORDER — BUPIVACAINE HCL (PF) 0.5 % IJ SOLN
INTRAMUSCULAR | Status: AC
  Filled 2018-05-01: qty 30

## 2018-05-01 MED ORDER — PHENYLEPHRINE HCL-NACL 20-0.9 MG/250ML-% IV SOLN
INTRAVENOUS | Status: DC | PRN
  Administered 2018-05-01: 30 ug/min via INTRAVENOUS

## 2018-05-01 MED ORDER — SOD CITRATE-CITRIC ACID 500-334 MG/5ML PO SOLN
ORAL | Status: AC
  Filled 2018-05-01: qty 30

## 2018-05-01 MED ORDER — FENTANYL CITRATE (PF) 100 MCG/2ML IJ SOLN
INTRAMUSCULAR | Status: DC | PRN
  Administered 2018-05-01: 15 ug via INTRATHECAL

## 2018-05-01 MED ORDER — SODIUM CHLORIDE 0.9 % IV SOLN
510.0000 mg | INTRAVENOUS | Status: DC
  Filled 2018-05-01: qty 17

## 2018-05-01 MED ORDER — SCOPOLAMINE 1 MG/3DAYS TD PT72
1.0000 | MEDICATED_PATCH | Freq: Once | TRANSDERMAL | Status: DC
  Administered 2018-05-01: 09:00:00 1.5 mg via TRANSDERMAL

## 2018-05-01 MED ORDER — ONDANSETRON HCL 4 MG/2ML IJ SOLN: 4.0000 mg | Freq: Three times a day (TID) | INTRAMUSCULAR | Status: DC | PRN

## 2018-05-01 MED ORDER — SODIUM CHLORIDE 0.9 % IR SOLN
Status: DC | PRN
  Administered 2018-05-01: 1

## 2018-05-01 MED ORDER — DIPHENHYDRAMINE HCL 50 MG/ML IJ SOLN: 12.5000 mg | INTRAMUSCULAR | Status: DC | PRN

## 2018-05-01 MED ORDER — SIMETHICONE 80 MG PO CHEW: 80.0000 mg | CHEWABLE_TABLET | ORAL | Status: DC | PRN

## 2018-05-01 MED ORDER — LACTATED RINGERS IV SOLN
INTRAVENOUS | Status: DC
  Administered 2018-05-01 (×2): via INTRAVENOUS

## 2018-05-01 MED ORDER — ONDANSETRON HCL 4 MG/2ML IJ SOLN
INTRAMUSCULAR | Status: AC
  Filled 2018-05-01: qty 2

## 2018-05-01 MED ORDER — CEFAZOLIN SODIUM-DEXTROSE 2-4 GM/100ML-% IV SOLN
INTRAVENOUS | Status: AC
  Filled 2018-05-01: qty 100

## 2018-05-01 MED ORDER — SCOPOLAMINE 1 MG/3DAYS TD PT72: 1.0000 | MEDICATED_PATCH | Freq: Once | TRANSDERMAL | Status: DC

## 2018-05-01 MED ORDER — TRANEXAMIC ACID-NACL 1000-0.7 MG/100ML-% IV SOLN
1000.0000 mg | INTRAVENOUS | Status: AC
  Administered 2018-05-01: 1000 mg via INTRAVENOUS

## 2018-05-01 MED ORDER — MORPHINE SULFATE (PF) 0.5 MG/ML IJ SOLN: INTRAMUSCULAR | Status: DC | PRN

## 2018-05-01 MED ORDER — SIMETHICONE 80 MG PO CHEW
80.0000 mg | CHEWABLE_TABLET | Freq: Three times a day (TID) | ORAL | Status: DC
  Administered 2018-05-01 – 2018-05-04 (×7): 80 mg via ORAL
  Filled 2018-05-01 (×7): qty 1

## 2018-05-01 MED ORDER — MORPHINE SULFATE (PF) 0.5 MG/ML IJ SOLN
INTRAMUSCULAR | Status: AC
  Filled 2018-05-01: qty 10

## 2018-05-01 MED ORDER — NALOXONE HCL 4 MG/10ML IJ SOLN
1.0000 ug/kg/h | INTRAVENOUS | Status: DC | PRN
  Filled 2018-05-01: qty 5

## 2018-05-01 MED ORDER — DIPHENHYDRAMINE HCL 25 MG PO CAPS: 25.0000 mg | ORAL_CAPSULE | ORAL | Status: DC | PRN

## 2018-05-01 MED ORDER — OXYCODONE HCL 5 MG PO TABS: 5.0000 mg | ORAL_TABLET | Freq: Once | ORAL | Status: DC | PRN

## 2018-05-01 MED ORDER — CEFAZOLIN SODIUM-DEXTROSE 2-4 GM/100ML-% IV SOLN
2.0000 g | INTRAVENOUS | Status: AC
  Administered 2018-05-01: 10:00:00 2 g via INTRAVENOUS

## 2018-05-01 MED ORDER — SIMETHICONE 80 MG PO CHEW
80.0000 mg | CHEWABLE_TABLET | ORAL | Status: DC
  Administered 2018-05-02 – 2018-05-03 (×3): 80 mg via ORAL
  Filled 2018-05-01 (×3): qty 1

## 2018-05-01 MED ORDER — OXYCODONE HCL 5 MG/5ML PO SOLN: 5.0000 mg | Freq: Once | ORAL | Status: DC | PRN

## 2018-05-01 MED ORDER — METOCLOPRAMIDE HCL 5 MG/ML IJ SOLN: 10.0000 mg | Freq: Once | INTRAMUSCULAR | Status: DC | PRN

## 2018-05-01 MED ORDER — PHENYLEPHRINE HCL-NACL 20-0.9 MG/250ML-% IV SOLN
INTRAVENOUS | Status: AC
  Filled 2018-05-01: qty 250

## 2018-05-01 MED ORDER — BUPIVACAINE IN DEXTROSE 0.75-8.25 % IT SOLN
INTRATHECAL | Status: DC | PRN
  Administered 2018-05-01: 1.6 mg via INTRATHECAL

## 2018-05-01 MED ORDER — WITCH HAZEL-GLYCERIN EX PADS: 1.0000 "application " | MEDICATED_PAD | CUTANEOUS | Status: DC | PRN

## 2018-05-01 MED ORDER — BUPIVACAINE HCL (PF) 0.5 % IJ SOLN
INTRAMUSCULAR | Status: DC | PRN
  Administered 2018-05-01: 30 mL

## 2018-05-01 MED ORDER — DIPHENHYDRAMINE HCL 25 MG PO CAPS: 25.0000 mg | ORAL_CAPSULE | Freq: Four times a day (QID) | ORAL | Status: DC | PRN

## 2018-05-01 MED ORDER — NALOXONE HCL 0.4 MG/ML IJ SOLN: 0.4000 mg | INTRAMUSCULAR | Status: DC | PRN

## 2018-05-01 SURGICAL SUPPLY — 34 items
BARRIER ADHS 3X4 INTERCEED (GAUZE/BANDAGES/DRESSINGS) IMPLANT
BENZOIN TINCTURE PRP APPL 2/3 (GAUZE/BANDAGES/DRESSINGS) ×3 IMPLANT
CHLORAPREP W/TINT 26ML (MISCELLANEOUS) ×3 IMPLANT
CLAMP CORD UMBIL (MISCELLANEOUS) IMPLANT
CLOSURE STERI-STRIP 1/2X4 (GAUZE/BANDAGES/DRESSINGS) ×1
CLOTH BEACON ORANGE TIMEOUT ST (SAFETY) ×3 IMPLANT
CLSR STERI-STRIP ANTIMIC 1/2X4 (GAUZE/BANDAGES/DRESSINGS) ×2 IMPLANT
DRSG OPSITE POSTOP 4X10 (GAUZE/BANDAGES/DRESSINGS) ×3 IMPLANT
ELECT REM PT RETURN 9FT ADLT (ELECTROSURGICAL) ×3
ELECTRODE REM PT RTRN 9FT ADLT (ELECTROSURGICAL) ×1 IMPLANT
EXTRACTOR VACUUM KIWI (MISCELLANEOUS) IMPLANT
GLOVE BIO SURGEON STRL SZ 6.5 (GLOVE) ×2 IMPLANT
GLOVE BIO SURGEONS STRL SZ 6.5 (GLOVE) ×1
GLOVE BIOGEL PI IND STRL 7.0 (GLOVE) ×2 IMPLANT
GLOVE BIOGEL PI INDICATOR 7.0 (GLOVE) ×4
GOWN STRL REUS W/TWL LRG LVL3 (GOWN DISPOSABLE) ×6 IMPLANT
KIT ABG SYR 3ML LUER SLIP (SYRINGE) IMPLANT
NEEDLE HYPO 22GX1.5 SAFETY (NEEDLE) IMPLANT
NEEDLE HYPO 25X5/8 SAFETYGLIDE (NEEDLE) IMPLANT
NS IRRIG 1000ML POUR BTL (IV SOLUTION) ×3 IMPLANT
PACK C SECTION WH (CUSTOM PROCEDURE TRAY) ×3 IMPLANT
PAD ABD 7.5X8 STRL (GAUZE/BANDAGES/DRESSINGS) ×6 IMPLANT
PAD OB MATERNITY 4.3X12.25 (PERSONAL CARE ITEMS) ×3 IMPLANT
PENCIL SMOKE EVAC W/HOLSTER (ELECTROSURGICAL) ×3 IMPLANT
RETRACTOR WND ALEXIS 25 LRG (MISCELLANEOUS) IMPLANT
RTRCTR WOUND ALEXIS 25CM LRG (MISCELLANEOUS)
SUT VIC AB 0 CT1 36 (SUTURE) ×18 IMPLANT
SUT VIC AB 2-0 CT1 27 (SUTURE) ×2
SUT VIC AB 2-0 CT1 TAPERPNT 27 (SUTURE) ×1 IMPLANT
SUT VIC AB 4-0 PS2 27 (SUTURE) ×3 IMPLANT
SYR CONTROL 10ML LL (SYRINGE) IMPLANT
TOWEL OR 17X24 6PK STRL BLUE (TOWEL DISPOSABLE) ×3 IMPLANT
TRAY FOLEY W/BAG SLVR 14FR LF (SET/KITS/TRAYS/PACK) IMPLANT
WATER STERILE IRR 1000ML POUR (IV SOLUTION) ×3 IMPLANT

## 2018-05-01 NOTE — Op Note (Signed)
Dorothy Haas PROCEDURE DATE: 05/01/2018  PREOPERATIVE DIAGNOSES: Intrauterine pregnancy at 409w0d weeks gestation; previous uterine incision kerr x2  POSTOPERATIVE DIAGNOSES: The same  PROCEDURE: Repeat Low Transverse Cesarean Section  SURGEON:  Dr. Scheryl DarterJames Arnold  ASSISTANT:  Gwenevere AbbotNimeka Kharisma Glasner, MD   ANESTHESIOLOGY TEAM: Anesthesiologist: Beryle LatheBrock, Thomas E, MD CRNA: Algis GreenhouseBurger, Linda A, CRNA  INDICATIONS: Dorothy Haas is a 28 y.o. [id number] at 449w0d here for cesarean section secondary to the indications listed under preoperative diagnoses; please see preoperative note for further details.  The risks of cesarean section were discussed with the patient including but were not limited to: bleeding which may require transfusion or reoperation; infection which may require antibiotics; injury to bowel, bladder, ureters or other surrounding organs; injury to the fetus; need for additional procedures including hysterectomy in the event of a life-threatening hemorrhage; placental abnormalities wth subsequent pregnancies, incisional problems, thromboembolic phenomenon and other postoperative/anesthesia complications.   The patient concurred with the proposed plan, giving informed written consent for the procedure.    FINDINGS:  Viable female infant in cephalic presentation.  Apgars 8 and 8.  Clear amniotic fluid.  Intact placenta, three vessel cord.  Large uterine window ~6x3cm in lower uterine segment.  Normal fallopian tubes and ovaries bilaterally.  ANESTHESIA: Spinal INTRAVENOUS FLUIDS: 1700 ml   ESTIMATED BLOOD LOSS: 150 ml URINE OUTPUT:  200 ml SPECIMENS: Placenta sent to L&D COMPLICATIONS: None immediate  PROCEDURE IN DETAIL:  The patient preoperatively received intravenous antibiotics and had sequential compression devices applied to her lower extremities.  She was then taken to the operating room where spinal anesthesia was administered and was found to be adequate. She was then  placed in a dorsal supine position with a leftward tilt, and prepped and draped in a sterile manner.  A foley catheter was placed into her bladder and attached to constant gravity.  After an adequate timeout was performed, a Pfannenstiel skin incision was made with scalpel on her preexisting scar and carried through to the underlying layer of fascia. The fascia was incised in the midline, and this incision was extended bilaterally using the Mayo scissors.  Kocher clamps were applied to the superior aspect of the fascial incision and the underlying rectus muscles were dissected off bluntly and sharply.  A similar process was carried out on the inferior aspect of the fascial incision. The rectus muscles were separated in the midline and the peritoneum was entered bluntly. The Alexis self-retaining retractor was introduced into the abdominal cavity.  Attention was turned to the lower uterine segment where a low transverse hysterotomy was made with a scalpel and extended bilaterally bluntly.  The infant was successfully delivered, the cord was clamped and cut after one minute, and the infant was handed over to the awaiting neonatology team. Uterine massage was then administered, and the placenta delivered intact with a three-vessel cord. The uterus was then cleared of clots and debris.  The hysterotomy was closed with 0 Vicryl in a running locked fashion, and an imbricating layer was also placed with 0 Vicryl.   The pelvis was cleared of all clot and debris. Hemostasis was confirmed on all surfaces.  The retractor was removed.  The peritoneum was closed with a 0 Vicryl running stitch. The fascia was then closed using 0 Vicryl in a running fashion.  The subcutaneous layer was irrigated, and the skin was closed with a 4-0 Vicryl subcuticular stitch. The patient tolerated the procedure well. Sponge, instrument and needle counts were correct x  3.  She was taken to the recovery room in stable condition.   Gwenevere Abbot,  MD Ob Fellow, [facility] for [employer], [facility]

## 2018-05-01 NOTE — Progress Notes (Signed)
Arabic interpreter [phone] used for PACU admission and initial recovery

## 2018-05-01 NOTE — Anesthesia Procedure Notes (Addendum)
Spinal  Patient location during procedure: OR Start time: 05/01/2018 9:54 AM End time: 05/01/2018 9:57 AM Staffing Anesthesiologist: Beryle Lathe, MD Performed: anesthesiologist  Preanesthetic Checklist Completed: patient identified, surgical consent, pre-op evaluation, timeout performed, IV checked, risks and benefits discussed and monitors and equipment checked Spinal Block Patient position: sitting Prep: DuraPrep Patient monitoring: heart rate, cardiac monitor, continuous pulse ox and blood pressure Approach: midline Location: L3-4 Injection technique: single-shot Needle Needle type: Pencan  Needle gauge: 24 G Additional Notes Consent was obtained prior to the procedure with all questions answered and concerns addressed. Risks including, but not limited to, bleeding, infection, nerve damage, paralysis, failed block, inadequate analgesia, allergic reaction, high spinal, itching, and headache were discussed and the patient wished to proceed. Functioning IV was confirmed and monitors were applied. Sterile prep and drape, including hand hygiene, mask, and sterile gloves were used. The patient was positioned and the spine was prepped. The skin was anesthetized with lidocaine. Free flow of clear CSF was obtained prior to injecting local anesthetic into the CSF. The spinal needle aspirated freely following injection. The needle was carefully withdrawn. The patient tolerated the procedure well.   Leslye Peer, MD

## 2018-05-01 NOTE — Progress Notes (Signed)
Interpreter number [phone], speaking Arabic, for admission questions and all tasks upon admission

## 2018-05-01 NOTE — Anesthesia Preprocedure Evaluation (Addendum)
Anesthesia Evaluation  Patient identified by MRN, date of birth, ID band Patient awake    Reviewed: Allergy & Precautions, NPO status , Patient's Chart, lab work & pertinent test results  History of Anesthesia Complications Negative for: history of anesthetic complications  Airway Mallampati: II  TM Distance: >3 FB Neck ROM: Full    Dental   Pulmonary neg pulmonary ROS,    breath sounds clear to auscultation       Cardiovascular negative cardio ROS   Rhythm:Regular Rate:Normal     Neuro/Psych negative neurological ROS  negative psych ROS   GI/Hepatic Neg liver ROS, GERD  Medicated,  Endo/Other  negative endocrine ROS  Renal/GU negative Renal ROS     Musculoskeletal negative musculoskeletal ROS (+)   Abdominal   Peds  Hematology  (+) anemia ,  Thrombocytopenia    Anesthesia Other Findings   Reproductive/Obstetrics (+) Pregnancy                            Anesthesia Physical Anesthesia Plan  ASA: II  Anesthesia Plan: Spinal   Post-op Pain Management:    Induction:   PONV Risk Score and Plan: 2 and Treatment may vary due to age or medical condition, Ondansetron and Scopolamine patch - Pre-op  Airway Management Planned: Natural Airway  Additional Equipment: None  Intra-op Plan:   Post-operative Plan:   Informed Consent: I have reviewed the patients History and Physical, chart, labs and discussed the procedure including the risks, benefits and alternatives for the proposed anesthesia with the patient or authorized representative who has indicated his/her understanding and acceptance.       Plan Discussed with: CRNA and Anesthesiologist  Anesthesia Plan Comments: (Arabic interpreter used for H&P and consent. Labs reviewed. Discussed risks and benefits of spinal, including spinal/epidural hematoma, infection, failed block, and PDPH. Special focus placed on bleeding risk  during discussion with patient given thrombocytopenia, including signs and symptoms of spinal/epidural hematoma to watch for. Stressed importance of alerting nurse or other member of medical team immediately if any of these signs/symptoms arise. Patient expressed understanding and wished to proceed. )       Anesthesia Quick Evaluation

## 2018-05-01 NOTE — H&P (Signed)
Obstetric Preoperative History and Physical  Dorothy Haas is a 28 y.o. [id number] with IUP at [redacted]w[redacted]d presenting for scheduled cesarean section.  Reports good fetal movement, no bleeding, no contractions, no leaking of fluid.  No acute preoperative concerns.    Cesarean Section Indication: previous uterine incision kerr x2  Prenatal Course Source of Care: [facility]  with onset of care at 7 weeks Pregnancy complications or risks: Patient Active Problem List   Diagnosis Date Noted  . Anemia affecting pregnancy 02/11/2018  . GERD (gastroesophageal reflux disease) 02/02/2018  . Racing heart beat 02/02/2018  . Ptyalism 10/14/2017  . ?Hyperthyroidism 10/13/2017  . Transaminitis 10/12/2017  . Nausea and vomiting during pregnancy prior to [redacted] weeks gestation 10/12/2017  . Hypokalemia 10/12/2017  . Hypomagnesemia 10/12/2017  . Supervision of other normal pregnancy, antepartum 10/10/2017  . Previous cesarean section x 2 10/10/2017  . 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   Prenatal labs and studies: ABO, Rh: --/--/O POS, O POS Performed at [facility], [address], [place], [place] [address]  [phone]) Antibody: NEG (04/20 0757) Rubella: 8.56 (09/30 1041) RPR: Non Reactive (03/04 0925)  HBsAg: Negative (10/02 1815)  HIV: Non Reactive (03/04 0925)  GBS:  None 1 hr Glucola  79/148/115 Genetic screening normal Anatomy US normal  Prenatal Transfer Tool  Maternal Diabetes: No Genetic Screening: Normal Maternal Ultrasounds/Referrals: Normal Fetal Ultrasounds or other Referrals:  None Maternal Substance Abuse:  No Significant Maternal Medications:  None Significant Maternal Lab Results: None  History reviewed. No pertinent past medical history.  Past Surgical History:  Procedure Laterality Date  . CESAREAN SECTION     x 2    OB History  Gravida Para Term Preterm AB Living  3 2 2     2   SAB  TAB Ectopic Multiple Live Births          2    # Outcome Date GA Lbr Len/2nd Weight Sex Delivery Anes PTL Lv  3 Current           2 Term 01/20/13    M CS-Unspec Spinal  LIV  1 Term 05/10/09    F CS-Unspec Spinal  LIV     Complications: Cephalopelvic Disproportion    Social History   Socioeconomic History  . Marital status: Married    Spouse name: Not on file  . Number of children: Not on file  . Years of education: Not on file  . Highest education level: Not on file  Occupational History  . Not on file  Social Needs  . Financial resource strain: Not on file  . Food insecurity:    Worry: Not on file    Inability: Not on file  . Transportation needs:    Medical: Not on file    Non-medical: Not on file  Tobacco Use  . Smoking status: Never Smoker  . Smokeless tobacco: Never Used  Substance and Sexual Activity  . Alcohol use: Never    Frequency: Never  . Drug use: Never  . Sexual activity: Yes    Birth control/protection: None  Lifestyle  . Physical activity:    Days per week: Not on file    Minutes per session: Not on file  . Stress: Not on file  Relationships  . Social connections:    Talks on phone: Not on file    Gets together: Not on file    Attends religious service: Not  on file    Active member of club or organization: Not on file    Attends meetings of clubs or organizations: Not on file    Relationship status: Not on file  Other Topics Concern  . Not on file  Social History Narrative  . Not on file    No family history on file.  Medications Prior to Admission  Medication Sig Dispense Refill Last Dose  . famotidine (PEPCID) 20 MG tablet Take 1 tablet (20 mg total) by mouth 2 (two) times daily. 30 tablet 3 04/30/2018 at Unknown time  . pantoprazole (PROTONIX) 40 MG tablet Take 1 tablet (40 mg total) by mouth daily. (Patient taking differently: Take 40 mg by mouth daily before lunch. ) 90 tablet 1 Taking  . Prenat-FeFmCb-DSS-FA-DHA w/o A (CITRANATAL  HARMONY) 27-1-260 MG CAPS Take 1 capsule by mouth at bedtime.    04/30/2018 at Unknown time  . bisacodyl (DULCOLAX) 10 MG suppository Place 1 suppository (10 mg total) rectally daily as needed for moderate constipation. (Patient not taking: Reported on 11/07/2017) 12 suppository 1 Not Taking  . glycopyrrolate (ROBINUL) 1 MG tablet Take 1 tablet (1 mg total) by mouth 3 (three) times daily. (Patient not taking: Reported on 11/07/2017) 90 tablet 2 Not Taking  . metoCLOPramide (REGLAN) 10 MG tablet Take 1 tablet (10 mg total) by mouth daily. (Patient not taking: Reported on 01/05/2018) 30 tablet 3 Not Taking  . ondansetron (ZOFRAN-ODT) 8 MG disintegrating tablet Take 1 tablet (8 mg total) by mouth every 8 (eight) hours as needed for nausea or vomiting. (Patient not taking: Reported on 01/05/2018) 30 tablet 4 Not Taking  . polyethylene glycol powder (GLYCOLAX/MIRALAX) powder Take 17 g by mouth daily as needed for moderate constipation. (Patient not taking: Reported on 11/07/2017) 500 g 2 Not Taking  . potassium chloride 20 MEQ/15ML (10%) SOLN Take 30 mLs (40 mEq total) by mouth 2 (two) times daily for 5 days. (Patient not taking: Reported on 04/18/2018) 473 mL 0 Not Taking at Unknown time  . scopolamine (TRANSDERM-SCOP) 1 MG/3DAYS Place 1 patch (1.5 mg total) onto the skin every 3 (three) days. (Patient not taking: Reported on 11/07/2017) 10 patch 12 Not Taking  . sucralfate (CARAFATE) 1 GM/10ML suspension Take 10 mLs (1 g total) by mouth 4 (four) times daily -  with meals and at bedtime. (Patient not taking: Reported on 04/18/2018) 420 mL 3 Not Taking at Unknown time    Allergies  Allergen Reactions  . Eggs Or Egg-Derived Products Nausea And Vomiting  . Promethazine     Hallucination    Review of Systems: Pertinent items noted in HPI and remainder of comprehensive ROS otherwise negative.  Physical Exam: BP (!) 131/97 (BP Location: Left Arm)   Pulse 79   Temp 98.4 F (36.9 C) (Oral)   Resp 18   Ht 5'  2" (1.575 m)   Wt 54.4 kg   LMP 08/01/2017 (Exact Date)   SpO2 100%   BMI 21.95 kg/m  FHR by Doppler: 136 bpm CONSTITUTIONAL: Well-developed, well-nourished female in no acute distress.  HENT:  Normocephalic, atraumatic, External right and left ear normal. Oropharynx is clear and moist EYES: Conjunctivae and EOM are normal. Pupils are equal, round, and reactive to light. No scleral icterus.  NECK: Normal range of motion, supple, no masses SKIN: Skin is warm and dry. No rash noted. Not diaphoretic. No erythema. No pallor. NEUROLGIC: Alert and oriented to person, place, and time. Normal reflexes, muscle tone coordination. No cranial nerve  deficit noted. PSYCHIATRIC: Normal mood and affect. Normal behavior. Normal judgment and thought content. CARDIOVASCULAR: Normal heart rate noted, regular rhythm RESPIRATORY: Effort and breath sounds normal, no problems with respiration noted ABDOMEN: Soft, nontender, nondistended, gravid. Well-healed Pfannenstiel incision. PELVIC: Deferred MUSCULOSKELETAL: Normal range of motion. No edema and no tenderness. 2+ distal pulses.   Pertinent Labs/Studies:   Results for orders placed or performed during the hospital encounter of 05/01/18 (from the past 72 hour(s))  Type and screen     Status: None   Collection Time: 05/01/18  7:57 AM  Result Value Ref Range   ABO/RH(D) O POS    Antibody Screen NEG    Sample Expiration      05/04/2018 Performed at [facility], [address], [place], [place] [address]   ABO/Rh     Status: None (Preliminary result)   Collection Time: 05/01/18  7:57 AM  Result Value Ref Range   ABO/RH(D)      O POS Performed at [facility], [address], [place], [place] [address]   CBC     Status: Abnormal   Collection Time: 05/01/18  8:00 AM  Result Value Ref Range   WBC 6.0 4.0 - 10.5 K/uL   RBC 4.04 3.87 - 5.11 MIL/uL   Hemoglobin 11.2 (L) 12.0 - 15.0 g/dL   HCT 09.8 (L) 11.9 - 14.7 %   MCV 88.1 80.0 - 100.0  fL   MCH 27.7 26.0 - 34.0 pg   MCHC 31.5 30.0 - 36.0 g/dL   RDW 82.9 (H) 56.2 - 13.0 %   Platelets 84 (L) 150 - 400 K/uL    Comment: REPEATED TO VERIFY PLATELET COUNT CONFIRMED BY SMEAR Immature Platelet Fraction may be clinically indicated, consider ordering this additional test [id number]    nRBC 0.0 0.0 - 0.2 %    Comment: Performed at [facility], [address] [place], [place] [address]    Assessment and Plan :Dorothy Haas is a 28 y.o. G3P2002 at [redacted]w[redacted]d being admitted for scheduled cesarean section. The risks of cesarean section discussed with the patient included but were not limited to: bleeding which may require transfusion or reoperation; infection which may require antibiotics; injury to bowel, bladder, ureters or other surrounding organs; injury to the fetus; need for additional procedures including hysterectomy in the event of a life-threatening hemorrhage; placental abnormalities wth subsequent pregnancies, incisional problems, thromboembolic phenomenon and other postoperative/anesthesia complications. The patient concurred with the proposed plan, giving informed written consent for the procedure. Patient has been NPO since last night she will remain NPO for procedure. Anesthesia and OR aware. Preoperative prophylactic antibiotics and SCDs ordered on call to the OR. To OR when ready.    Dorothy Abbot, MD Ob Fellow, [facility] for [employer], [facility]

## 2018-05-01 NOTE — Anesthesia Postprocedure Evaluation (Signed)
Anesthesia Post Note  Patient: Dorothy Haas  Procedure(s) Performed: CESAREAN SECTION (N/A )     Patient location during evaluation: PACU Anesthesia Type: Spinal Level of consciousness: awake and alert Pain management: pain level controlled Vital Signs Assessment: post-procedure vital signs reviewed and stable Respiratory status: spontaneous breathing and respiratory function stable Cardiovascular status: blood pressure returned to baseline and stable Postop Assessment: spinal receding and no apparent nausea or vomiting Anesthetic complications: no    Last Vitals:  Vitals:   05/01/18 1235 05/01/18 1236  BP:    Pulse: 72 70  Resp: 16 (!) 21  Temp:    SpO2: 97% 97%    Last Pain:  Vitals:   05/01/18 1230  TempSrc:   PainSc: 0-No pain   Pain Goal: Patients Stated Pain Goal: 2 (05/01/18 1217)  LLE Motor Response: Purposeful movement (05/01/18 1215) LLE Sensation: Tingling (05/01/18 1215) RLE Motor Response: Purposeful movement (05/01/18 1215) RLE Sensation: Tingling (05/01/18 1215)     Epidural/Spinal Function Cutaneous sensation: Tingles (05/01/18 1215), Patient able to flex knees: No (05/01/18 1215), Patient able to lift hips off bed: No (05/01/18 1215), Back pain beyond tenderness at insertion site: No (05/01/18 1215), Progressively worsening motor and/or sensory loss: No (05/01/18 1215), Bowel and/or bladder incontinence post epidural: No (05/01/18 1215)  Beryle Lathe

## 2018-05-01 NOTE — Discharge Summary (Signed)
Postpartum Discharge Summary     Patient Name: Dorothy Haas DOB: 01/27/1990 MRN: [id number]  Date of admission: 05/01/2018 Delivering Provider: Adam PhenixARNOLD, JAMES G   Date of discharge: 05/03/2018  Admitting diagnosis: RCS Intrauterine pregnancy: 2844w0d     Secondary diagnosis:  Active Problems:   Previous cesarean section x 2   Anemia affecting pregnancy   Cesarean delivery delivered   Uterine scar from previous surgery affecting pregnancy   Gestational thrombocytopenia [facility](HCC)  Additional problems: None     Discharge diagnosis: Term Pregnancy Delivered, Anemia and gestational thrombocytopenia                                                                                                Post partum procedures:IV feraheme on 05-01-2018  Augmentation: NA  Complications: None  Hospital course:  Scheduled C/S   28 y.o. yo G3P3003 at 8044w0d was admitted to the hospital 05/01/2018 for scheduled cesarean section with the following indication:Elective Repeat.  Membrane Rupture Time/Date: 10:18 AM ,05/01/2018   Patient delivered a Viable infant.05/01/2018  Details of operation can be found in separate operative note.  Pateint had an uncomplicated postpartum course.  She is ambulating, tolerating a regular diet, passing flatus, and urinating well. Patient is discharged home in stable condition on  05/03/18         Magnesium Sulfate recieved: No BMZ received: No  Physical exam  Vitals:   05/02/18 1419 05/02/18 2127 05/02/18 2131 05/03/18 0546  BP: 111/81 (!) 114/100 (!) 126/94 123/65  Pulse: (!) 58 62  93  Resp: 16 18  16   Temp: (!) 97.3 F (36.3 C) 97.7 F (36.5 C)  98.1 F (36.7 C)  TempSrc: Oral Axillary  Oral  SpO2:    100%  Weight:      Height:       General: alert, cooperative and no distress Lochia: appropriate Uterine Fundus: firm Incision: Healing well with no significant drainage, No significant erythema, Dressing with small less than 1/4 area of dark blood  this am, marked with no further bleeding. DVT Evaluation: No evidence of DVT seen on physical exam. Labs: Lab Results  Component Value Date   WBC 5.4 05/02/2018   HGB 10.7 (L) 05/02/2018   HCT 33.3 (L) 05/02/2018   MCV 87.4 05/02/2018   PLT 83 (L) 05/02/2018   CMP Latest Ref Rng & Units 05/01/2018  Glucose 65 - 99 mg/dL -  BUN 6 - 20 mg/dL -  Creatinine 1.320.44 - 4.401.00 mg/dL 1.020.49  Sodium 725134 - 366144 mmol/L -  Potassium 3.5 - 5.2 mmol/L -  Chloride 96 - 106 mmol/L -  CO2 20 - 29 mmol/L -  Calcium 8.7 - 10.2 mg/dL -  Total Protein 6.0 - 8.5 g/dL -  Total Bilirubin 0.0 - 1.2 mg/dL -  Alkaline Phos 39 - 440117 IU/L -  AST 0 - 40 IU/L -  ALT 0 - 32 IU/L -    Discharge instruction: per After Visit Summary and "Baby and Me Booklet".  After visit meds:  Allergies as of 05/03/2018      Reactions  Eggs Or Egg-derived Products Nausea And Vomiting   Promethazine    Hallucination      Medication List    STOP taking these medications   glycopyrrolate 1 MG tablet Commonly known as:  Robinul   metoCLOPramide 10 MG tablet Commonly known as:  REGLAN   ondansetron 8 MG disintegrating tablet Commonly known as:  Zofran ODT   pantoprazole 40 MG tablet Commonly known as:  Protonix   potassium chloride 20 MEQ/15ML (10%) Soln   scopolamine 1 MG/3DAYS Commonly known as:  TRANSDERM-SCOP   sucralfate 1 GM/10ML suspension Commonly known as:  Carafate     TAKE these medications   bisacodyl 10 MG suppository Commonly known as:  Dulcolax Place 1 suppository (10 mg total) rectally daily as needed for moderate constipation.   CitraNatal Harmony 27-1-260 MG Caps Take 1 capsule by mouth at bedtime.   famotidine 20 MG tablet Commonly known as:  PEPCID Take 1 tablet (20 mg total) by mouth 2 (two) times daily.   oxyCODONE-acetaminophen 5-325 MG tablet Commonly known as:  PERCOCET/ROXICET Take 1 tablet by mouth every 6 (six) hours as needed for moderate pain.   polyethylene glycol powder  17 GM/SCOOP powder Commonly known as:  GLYCOLAX/MIRALAX Take 17 g by mouth daily as needed for moderate constipation.       Diet: routine diet  Activity: Advance as tolerated. Pelvic rest for 6 weeks.   Outpatient follow up:6 weeks Follow up Appt: Future Appointments  Date Time Provider Department Center  05/15/2018 10:00 AM [facility] NURSE [facility]OC  06/06/2018  9:35 AM Adam Phenix, MD [facility]OC  08/21/2018  8:20 AM Corky Crafts, MD [facility]   Follow up Visit: Follow-up Information    [facility] Follow up.   Specialty:  Obstetrics and Gynecology Why:  Follow up on May 15, 2018 and Jun 06, 2018 in the office.   Contact information: [address], [address] [id number] mc [place] [address] [phone]         Please schedule this patient for Postpartum visit in: 6 weeks with the following provider: Any provider For C/S patients schedule nurse incision check in weeks 2 weeks: yes Low risk pregnancy complicated by: anemia, gestational thrombocytopenia Delivery mode:  CS Anticipated Birth Control:  Nexplanon. Pt desires at first incision check visit if possible.  PP Procedures needed: Incision check and CBC for platelets Schedule Integrated BH visit: no  Newborn Data: Live born female  Birth Weight: 7 lb 9.5 oz (3445 g) APGAR: 8, 8  Newborn Delivery   Birth date/time:  05/01/2018 10:18:00 Delivery type:  C-Section, Low Transverse Trial of labor:  No C-section categorization:  Repeat     Baby Feeding: Breast Disposition:home with mother   05/03/2018 Dorothy Haas, CNM

## 2018-05-01 NOTE — Transfer of Care (Signed)
Immediate Anesthesia Transfer of Care Note  Patient: Dorothy Haas  Procedure(s) Performed: CESAREAN SECTION (N/A )  Patient Location: PACU  Anesthesia Type:Spinal  Level of Consciousness: awake  Airway & Oxygen Therapy: Patient Spontanous Breathing  Post-op Assessment: Report given to RN  Post vital signs: Reviewed and stable  Last Vitals:  Vitals Value Taken Time  BP    Temp    Pulse    Resp    SpO2      Last Pain:  Vitals:   05/01/18 0744  TempSrc: Oral         Complications: No apparent anesthesia complications

## 2018-05-02 LAB — CBC
HCT: 33.3 % — ABNORMAL LOW (ref 36.0–46.0)
Hemoglobin: 10.7 g/dL — ABNORMAL LOW (ref 12.0–15.0)
MCH: 28.1 pg (ref 26.0–34.0)
MCHC: 32.1 g/dL (ref 30.0–36.0)
MCV: 87.4 fL (ref 80.0–100.0)
Platelets: 83 10*3/uL — ABNORMAL LOW (ref 150–400)
RBC: 3.81 MIL/uL — ABNORMAL LOW (ref 3.87–5.11)
RDW: 20.9 % — ABNORMAL HIGH (ref 11.5–15.5)
WBC: 5.4 10*3/uL (ref 4.0–10.5)
nRBC: 0 % (ref 0.0–0.2)

## 2018-05-02 LAB — ABO/RH: ABO/RH(D): O POS

## 2018-05-02 MED ORDER — LOPERAMIDE HCL 2 MG PO CAPS
2.0000 mg | ORAL_CAPSULE | Freq: Once | ORAL | Status: AC
  Administered 2018-05-02: 2 mg via ORAL
  Filled 2018-05-02: qty 1

## 2018-05-02 MED ORDER — GABAPENTIN 100 MG PO CAPS
100.0000 mg | ORAL_CAPSULE | Freq: Two times a day (BID) | ORAL | Status: DC
  Administered 2018-05-02 – 2018-05-04 (×5): 100 mg via ORAL
  Filled 2018-05-02 (×5): qty 1

## 2018-05-02 NOTE — Progress Notes (Addendum)
POSTOPERATIVE DAY # 1 S/P rLTCS   S:         Reports feeling well             Tolerating po intake / no nausea / no vomiting / neg flatus / yes BM             Bleeding is moderate             Pain controlled withibuprofen (OTC)             Up ad lib / ambulatory/ voiding QS  Newborn breast feeding  / Circumcision  NA  Successfully breastfeeding? yes    O:  VS: BP 137/85 (BP Location: Left Arm)   Pulse (!) 54   Temp 97.8 F (36.6 C) (Oral)   Resp 16   Ht 5\' 2"  (1.575 m)   Wt 54.4 kg   LMP 08/01/2017 (Exact Date)   SpO2 98%   Breastfeeding Unknown   BMI 21.95 kg/m    LABS:               Recent Labs    05/01/18 0800 05/02/18 0749  WBC 6.0 5.4  HGB 11.2* 10.7*  PLT 84* 83*               Bloodtype: --/--/O POS, O POS (04/20 0757)  Rubella: 8.56 (09/30 1041)                     EBL:                          I&O: Intake/Output      04/20 0701 - 04/21 0700 04/21 0701 - 04/22 0700   P.O. 180    I.V. (mL/kg) 1700 (31.3)    Total Intake(mL/kg) 1880 (34.6)    Urine (mL/kg/hr) 2275 150 (0.8)   Stool 0    Blood 438    Total Output 2713 150   Net -833 -150        Stool Occurrence 2 x                 Physical Exam:             Alert and Oriented X3  Lungs: Clear and unlabored  Heart: regular rate and rhythm / no mumurs  Abdomen: soft, non-tender, non-distended              Fundus: firm, non-tender, U=umbilicus             Dressing pressure dressing is clean and dry; did not remove to examine honeycomb.   Lochia: mild to moderate  Extremities: negative edema, no calf pain or tenderness, negative Homans   A:        POD # 1 S/P rLTCS POD#1.             Ambulating; has received Feraheme x1 inpatient;  Per Dr. Aneta Mins, will not give Lovenox today .   P:        Routine postoperative care              Platelets today are 83; yesterday were 84.   Follow up in 2 weeks with clinic for incision check   Special instructions/meds   Send home on iron   Anticipate discharge  on 05-03-2018    Charlesetta Garibaldi Jimi Giza CNM, 05/02/2018, 10:32 AM

## 2018-05-02 NOTE — Lactation Note (Signed)
This note was copied from a baby's chart. Lactation Consultation Note  Patient Name: Dorothy Haas SFKCL'E Date: 05/02/2018 Reason for consult: Initial assessment;Other (Comment)(experienced Breastfeeeder x 2 )  Arabic interpreter [phone] [employer] interpreter  Baby is 24 1/2 hours old / As LC entered the room mom lying on her left side and baby was breast feeding with swallows.  Mom had mentioned she had been feeding since 1215 . And released while LC present - 15 mins .  Per mom was comfortable with feeding.  Baby fussy/ LC checked diaper and she was wet / and changed it.  LC offered to help her switch to the right breast / football and mom was receptive. LC reviewed And assisted with the football position and worked on depth with mom. Mom needed instruction  To tickle the baby's upper lip and wait for wide open mouth and then latch. Baby was able to open wide.  Multiple swallows noted and increased with breast compressions. Baby ate for 8 mins and released.  LC reviewed the importance of STS feedings and the importance of obtaining a deep latch to enhance ' Let down and decrease the potential for soreness.   Mom mentioned with her 2nd baby when she went to wean had problems with engorgement and elevated termp X 20 days until the milk dried up. She never went for and treatment for the temp.   LC reviewed the importance of being tx if it occurs again. And reviewed S/S of mastitis.  Per mom does not have a pump at home/ and is active with [place] / WIC .  Mom receptive to review of breast feeding basics ( its been 5 years ).   Mom will need a hand pump prior to D/C.   Mom aware of the virtual BFSG and need to sign up/ and breast feeding phone line and LC O/P by appt.     Maternal Data Has patient been taught Hand Expression?: Yes(steady flow of colostrum ) Does the patient have breastfeeding experience prior to this delivery?: Yes  Feeding Feeding Type: Breast Fed  LATCH  Score Latch: Grasps breast easily, tongue down, lips flanged, rhythmical sucking.  Audible Swallowing: Spontaneous and intermittent  Type of Nipple: Everted at rest and after stimulation  Comfort (Breast/Nipple): Soft / non-tender  Hold (Positioning): Assistance needed to correctly position infant at breast and maintain latch.  LATCH Score: 9  Interventions Interventions: Breast feeding basics reviewed;Assisted with latch;Skin to skin;Breast massage;Hand express;Breast compression;Adjust position;Support pillows;Position options;Expressed milk  Lactation Tools Discussed/Used WIC Program: Yes(per mom active )   Consult Status Consult Status: Follow-up Date: 05/03/18 Follow-up type: In-patient    [name] 05/02/2018, 1:05 PM

## 2018-05-03 MED ORDER — OXYCODONE-ACETAMINOPHEN 5-325 MG PO TABS: 1.0000 | ORAL_TABLET | Freq: Four times a day (QID) | ORAL | 0 refills | Status: DC | PRN

## 2018-05-03 NOTE — Lactation Note (Signed)
This note was copied from a baby's chart. Lactation Consultation Note  Patient Name: Dorothy Haas TWSFK'C Date: 05/03/2018 Reason for consult: Follow-up assessment;Infant weight loss Baby is 48 hours/9% weight loss.  Mom is currently feeding baby in side lying position.  Baby is on her back.  Assisted with positioning baby on her side.  She is latched well.  Few swallows noted.  Instructed mom to use breast massage and compression to increase volume infant obtains.  Breasts are soft.  Mom has no questions or concerns.  Instructed to feed with cues and call for assist prn.  Maternal Data    Feeding Feeding Type: Breast Fed  LATCH Score Latch: Grasps breast easily, tongue down, lips flanged, rhythmical sucking.  Audible Swallowing: A few with stimulation  Type of Nipple: Everted at rest and after stimulation  Comfort (Breast/Nipple): Soft / non-tender  Hold (Positioning): Assistance needed to correctly position infant at breast and maintain latch.  LATCH Score: 8  Interventions    Lactation Tools Discussed/Used     Consult Status Consult Status: Follow-up Date: 05/04/18 Follow-up type: In-patient    Huston Foley 05/03/2018, 11:04 AM

## 2018-05-04 NOTE — Discharge Instructions (Signed)
Home Care Instructions for Mom  Activity  · Gradually return to your regular activities.  · Let yourself rest. Nap while your baby sleeps.  · Avoid lifting anything that is heavier than 10 lb (4.5 kg) until your health care provider says it is okay.  · Avoid activities that take a lot of effort and energy (are strenuous) until approved by your health care provider. Walking at a slow-to-moderate pace is usually safe.  · If you had a cesarean delivery:  ? Do not vacuum, climb stairs, or drive a car for 4-6 weeks.  ? Have someone help you at home until you feel like you can do your usual activities yourself.  ? Do exercises as told by your health care provider, if this applies.  Vaginal bleeding  You may continue to bleed for 4-6 weeks after delivery. Over time, the amount of blood usually decreases and the color of the blood usually gets lighter. However, the flow of bright red blood may increase if you have been too active. If you need to use more than one pad in an hour because your pad gets soaked, or if you pass a large clot:  · Lie down.  · Raise your feet.  · Place a cold compress on your lower abdomen.  · Rest.  · Call your health care provider.  If you are breastfeeding, your period should return anytime between 8 weeks after delivery and the time that you stop breastfeeding. If you are not breastfeeding, your period should return 6-8 weeks after delivery.  Perineal care  The perineal area, or perineum, is the part of your body between your thighs. After delivery, this area needs special care. Follow these instructions as told by your health care provider.  · Take warm tub baths for 15-20 minutes.  · Use medicated pads and pain-relieving sprays and creams as told.  · Do not use tampons or douches until vaginal bleeding has stopped.  · Each time you go to the bathroom:  ? Use a peri bottle.  ? Change your pad.  ? Use towelettes in place of toilet paper until your stitches have healed.  · Do Kegel exercises  every day. Kegel exercises help to maintain the muscles that support the vagina, bladder, and bowels. You can do these exercises while you are standing, sitting, or lying down. To do Kegel exercises:  ? Tighten the muscles of your abdomen and the muscles that surround your birth canal.  ? Hold for a few seconds.  ? Relax.  ? Repeat until you have done this 5 times in a row.  · To prevent hemorrhoids from developing or getting worse:  ? Drink enough fluid to keep your urine clear or pale yellow.  ? Avoid straining when having a bowel movement.  ? Take over-the-counter medicines and stool softeners as told by your health care provider.  Breast care  · Wear a tight-fitting bra.  · Avoid taking over-the-counter pain medicine for breast discomfort.  · Apply ice to the breasts to help with discomfort as needed:  ? Put ice in a plastic bag.  ? Place a towel between your skin and the bag.  ? Leave the ice on for 20 minutes or as told by your health care provider.  Nutrition  · Eat a well-balanced diet.  · Do not try to lose weight quickly by cutting back on calories.  · Take your prenatal vitamins until your postpartum checkup or until your health care provider tells   you to stop.  Postpartum depression  You may find yourself crying for no apparent reason and unable to cope with all of the changes that come with having a newborn. This mood is called postpartum depression. Postpartum depression happens because your hormone levels change after delivery. If you have postpartum depression, get support from your partner, friends, and family. If the depression does not go away on its own after several weeks, contact your health care provider.  Breast self-exam    Do a breast self-exam each month, at the same time of the month. If you are breastfeeding, check your breasts just after a feeding, when your breasts are less full. If you are breastfeeding and your period has started, check your breasts on day 5, 6, or 7 of your  period.  Report any lumps, bumps, or discharge to your health care provider. Know that breasts are normally lumpy if you are breastfeeding. This is temporary, and it is not a health risk.  Intimacy and sexuality  Avoid sexual activity for at least 3-4 weeks after delivery or until the brownish-red vaginal flow is completely gone. If you want to avoid pregnancy, use some form of birth control. You can get pregnant after delivery, even if you have not had your period.  Contact a health care provider if:  · You feel unable to cope with the changes that a child brings to your life, and these feelings do not go away after several weeks.  · You notice a lump, a bump, or discharge on your breast.  Get help right away if:  · Blood soaks your pad in 1 hour or less.  · You have:  ? Severe pain or cramping in your lower abdomen.  ? A bad-smelling vaginal discharge.  ? A fever that is not controlled by medicine.  ? A fever, and an area of your breast is red and sore.  ? Pain or redness in your calf.  ? Sudden, severe chest pain.  ? Shortness of breath.  ? Painful or bloody urination.  ? Problems with your vision.  ? You vomit for 12 hours or longer.  ? You develop a severe headache.  ? You have serious thoughts about hurting yourself, your child, or anyone else.  This information is not intended to replace advice given to you by your health care provider. Make sure you discuss any questions you have with your health care provider.  Document Released: 12/26/1999 Document Revised: 02/23/2017 Document Reviewed: 07/01/2014  Elsevier Interactive Patient Education © 2019 [employer] Inc[employer]

## 2018-05-04 NOTE — Discharge Summary (Signed)
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 yo G3P3003 at 3552w0d was admitted to the hospital 05/01/2018 for scheduled cesarean section with the following indication:Elective Repeat.  Membrane Rupture Time/Date: 10:18 AM ,05/01/2018   Patient delivered a Viable infant.05/01/2018  Details of operation can be found in separate operative note.  Pateint had an uncomplicated postpartum course.  She is ambulating, tolerating a regular diet, passing flatus, and urinating well. Patient is discharged home in stable condition on  05/04/18         Magnesium Sulfate recieved: No BMZ received: No  Physical exam        Vitals:   05/02/18 1419 05/02/18 2127 05/02/18 2131 05/03/18 0546  BP: 111/81 (!) 114/100 (!) 126/94 123/65  Pulse: (!) 58 62  93  Resp: 16 18  16   Temp: (!) 97.3 F (36.3 C) 97.7 F (36.5 C)  98.1 F (36.7 C)  TempSrc: Oral Axillary  Oral  SpO2:    100%  Weight:      Height:       General: alert, cooperative and no distress Lochia: appropriate Uterine Fundus: firm Incision: Healing well with no significant  drainage, No significant erythema, Dressing with small less than 1/4 area of dark blood this am, marked with no further bleeding. DVT Evaluation: No evidence of DVT seen on physical exam. Labs: RecentLabs       Lab Results  Component Value Date   WBC 5.4 05/02/2018   HGB 10.7 (L) 05/02/2018   HCT 33.3 (L) 05/02/2018   MCV 87.4 05/02/2018   PLT 83 (L) 05/02/2018     CMP Latest Ref Rng & Units 05/01/2018  Glucose 65 - 99 mg/dL -  BUN 6 - 20 mg/dL -  Creatinine 4.090.44 - 8.111.00 mg/dL 9.140.49  Sodium 782134 - 956144 mmol/L -  Potassium 3.5 - 5.2 mmol/L -  Chloride 96 - 106 mmol/L -  CO2 20 - 29 mmol/L -  Calcium 8.7 - 10.2 mg/dL -  Total Protein 6.0 - 8.5 g/dL -  Total Bilirubin 0.0 - 1.2 mg/dL -  Alkaline Phos 39 - 127 IU/L -  AST 0 - 40 IU/L -  ALT 0 - 32 IU/L -    Discharge instruction: per After Visit Summary and "Baby and Me Booklet".  After visit meds:       Allergies as of 05/03/2018      Reactions   Eggs Or Egg-derived Products Nausea And Vomiting   Promethazine    Hallucination         Medication List    STOP taking these medications   glycopyrrolate 1 MG tablet Commonly known as:  Robinul   metoCLOPramide 10 MG tablet Commonly known as:  REGLAN   ondansetron 8 MG disintegrating tablet Commonly known as:  Zofran ODT   pantoprazole 40 MG tablet Commonly known as:  Protonix   potassium chloride 20 MEQ/15ML (10%) Soln   scopolamine 1 MG/3DAYS Commonly known as:  TRANSDERM-SCOP   sucralfate 1 GM/10ML suspension Commonly known as:  Carafate     TAKE these medications   bisacodyl 10 MG suppository Commonly known as:  Dulcolax Place 1 suppository (10 mg total) rectally daily as needed for moderate constipation.   CitraNatal Harmony 27-1-260 MG Caps Take 1 capsule by mouth at bedtime.   famotidine 20 MG tablet Commonly known as:  PEPCID Take 1 tablet (20 mg total) by mouth 2 (two) times daily.   oxyCODONE-acetaminophen 5-325 MG  tablet Commonly known as:  PERCOCET/ROXICET Take 1 tablet by mouth every 6 (six) hours as needed for moderate pain.   polyethylene glycol powder 17 GM/SCOOP powder Commonly known as:  GLYCOLAX/MIRALAX Take 17 g by mouth daily as needed for moderate constipation.       Diet: routine diet  Activity: Advance as tolerated. Pelvic rest for 6 weeks.   Outpatient follow up:6 weeks Follow up Appt:       Future Appointments  Date Time Provider Department Center  05/15/2018 10:00 AM [facility] NURSE [facility]OC  06/06/2018  9:35 AM Adam Phenix, MD [facility] WOC  08/21/2018  8:20 AM Corky Crafts, MD [facility]   Follow up Visit:    Follow-up Information    [facility] Follow up.   Specialty:  Obstetrics and Gynecology Why:  Follow up on May 15, 2018 and Jun 06, 2018 in the office.   Contact information: [address], [address] [id number] mc [place] [address] [phone]         Please schedule this patient for Postpartum visit in: 6 weeks with the following provider: Any provider For C/S patients schedule nurse incision check in weeks 2 weeks: yes Low risk pregnancy complicated by: anemia, gestational thrombocytopenia Delivery mode:  CS Anticipated Birth Control:  Nexplanon. Pt desires at first incision check visit if possible.  PP Procedures needed: Incision check and CBC for platelets Schedule Integrated BH visit: no  Newborn Data: Live born female  Birth Weight: 7 lb 9.5 oz (3445 g) APGAR: 8, 8  Newborn Delivery   Birth date/time:  05/01/2018 10:18:00 Delivery type:  C-Section, Low Transverse Trial of labor:  No C-section categorization:  Repeat     Baby Feeding: Breast Disposition:home with mother   Marcy Siren, D.O. [facility] Family Medicine Fellow, [facility], [facility] 05/04/2018, 7:30 AM

## 2018-05-04 NOTE — Lactation Note (Signed)
This note was copied from a baby's chart. Lactation Consultation Note  Patient Name: Dorothy Haas WCBJS'E Date: 05/04/2018 Reason for consult: Follow-up assessment Baby is 76 hours old/8% weight loss.  Mom has been mostly formula feeding.  She states breasts are feeling fuller.  Recommended she begin breastfeeding first before offering supplement.  Mom denies concerns.  Encouraged to call prn.  Maternal Data    Feeding Feeding Type: Formula Nipple Type: Slow - flow  LATCH Score                   Interventions    Lactation Tools Discussed/Used     Consult Status Consult Status: Complete Follow-up type: Call as needed    Huston Foley 05/04/2018, 3:01 PM

## 2018-05-15 ENCOUNTER — Ambulatory Visit (INDEPENDENT_AMBULATORY_CARE_PROVIDER_SITE_OTHER): Payer: Medicaid Other | Admitting: *Deleted

## 2018-05-15 ENCOUNTER — Other Ambulatory Visit: Payer: Self-pay

## 2018-05-15 VITALS — BP 123/85 | HR 81 | Temp 97.9°F | Ht 59.5 in | Wt 108.1 lb

## 2018-05-15 DIAGNOSIS — Z5189 Encounter for other specified aftercare: Secondary | ICD-10-CM

## 2018-05-15 NOTE — Progress Notes (Signed)
Pt presents for incision check.  Interpreter [phone] used for this visit. Pt denies any s/s of infection.  On exam, incision noted to be clean, dry, well approximated.  Steri strips removed.  S/s of infection reviewed with pt.  Care of incision reviewed with pt.  Pt verbalized understanding.

## 2018-05-16 NOTE — Progress Notes (Signed)
Chart reviewed for nurse visit. Agree with plan of care.   Rolm Bookbinder, [place] 05/15/18 10:05AM

## 2018-06-06 ENCOUNTER — Telehealth: Payer: Self-pay | Admitting: Student

## 2018-06-06 ENCOUNTER — Ambulatory Visit: Payer: Medicaid Other | Admitting: Obstetrics & Gynecology

## 2018-06-06 NOTE — Telephone Encounter (Signed)
Called the patient to inform of the upcoming appointment. Informed the patient of wearing a face mask and no visitors due to COVID19 restrictions. °

## 2018-06-07 ENCOUNTER — Other Ambulatory Visit: Payer: Self-pay

## 2018-06-07 ENCOUNTER — Ambulatory Visit (INDEPENDENT_AMBULATORY_CARE_PROVIDER_SITE_OTHER): Payer: Medicaid Other | Admitting: Obstetrics & Gynecology

## 2018-06-07 ENCOUNTER — Encounter: Payer: Self-pay | Admitting: Obstetrics & Gynecology

## 2018-06-07 DIAGNOSIS — Z30017 Encounter for initial prescription of implantable subdermal contraceptive: Secondary | ICD-10-CM

## 2018-06-07 DIAGNOSIS — Z1389 Encounter for screening for other disorder: Secondary | ICD-10-CM

## 2018-06-07 LAB — POCT PREGNANCY, URINE: Preg Test, Ur: NEGATIVE

## 2018-06-07 MED ORDER — ETONOGESTREL 68 MG ~~LOC~~ IMPL
68.0000 mg | DRUG_IMPLANT | Freq: Once | SUBCUTANEOUS | Status: AC
  Administered 2018-06-07: 68 mg via SUBCUTANEOUS

## 2018-06-07 MED ORDER — ETONOGESTREL 68 MG ~~LOC~~ IMPL: 68.0000 mg | DRUG_IMPLANT | Freq: Once | SUBCUTANEOUS | Status: DC

## 2018-06-07 NOTE — Progress Notes (Signed)
Subjective:     Dorothy Haas is a 28 y.o. female who presents for a postpartum visit. She is 6 weeks postpartum following a low cervical transverse Cesarean section. I have fully reviewed the prenatal and intrapartum course. The delivery was at 39 gestational weeks. Outcome: repeat cesarean section, low transverse incision. Anesthesia: spinal. Postpartum course has been unremarkable. Baby's course has been unrememarkable. Baby is feeding by breast. Bleeding staining only. Bowel function is normal. Bladder function is normal. Patient is not sexually active. Contraception method is Nexplanon. Postpartum depression screening: negative.  The following portions of the patient's history were reviewed and updated as appropriate: allergies, current medications, past family history, past medical history, past social history, past surgical history and problem list.  Review of Systems Pertinent items are noted in HPI.   Objective:    There were no vitals taken for this visit.  General:  alert   Breasts:  inspection negative, no nipple discharge or bleeding, no masses or nodularity palpable  Lungs: clear to auscultation bilaterally  Heart:  regular rate and rhythm, S1, S2 normal, no murmur, click, rub or gallop  Incision Healed well   Abdomen benign      UPT was negative. Consent was signed. Time out procedure was done. Her left arm was prepped with betadine and infiltrated with 3 cc of 1% lidocaine. After adequate anesthesia was assured, the Nexplanon device was placed according to standard of care. Her arm was hemostatic and was bandaged. She tolerated the procedure well.                  Assessment:    Normal postpartum exam. Pap smear not done at today's visit. It was normal in 2019  Plan:   Postpartum state: doing well Contraception: Nexplanon, rec back up method for 2 weeks

## 2018-06-07 NOTE — Addendum Note (Signed)
Addended by: Osvaldo Human on: 06/07/2018 10:46 AM   Modules accepted: Orders

## 2018-06-22 ENCOUNTER — Telehealth: Payer: Self-pay | Admitting: Obstetrics and Gynecology

## 2018-06-22 ENCOUNTER — Other Ambulatory Visit: Payer: Self-pay

## 2018-06-22 ENCOUNTER — Ambulatory Visit (HOSPITAL_COMMUNITY): Payer: Medicaid Other | Attending: Obstetrics and Gynecology | Admitting: Lactation Services

## 2018-06-22 ENCOUNTER — Ambulatory Visit (HOSPITAL_COMMUNITY): Payer: Medicaid Other

## 2018-06-22 DIAGNOSIS — N644 Mastodynia: Secondary | ICD-10-CM | POA: Insufficient documentation

## 2018-06-22 DIAGNOSIS — O9279 Other disorders of lactation: Secondary | ICD-10-CM | POA: Insufficient documentation

## 2018-06-22 DIAGNOSIS — O9229 Other disorders of breast associated with pregnancy and the puerperium: Secondary | ICD-10-CM

## 2018-06-22 NOTE — Lactation Note (Signed)
Lactation Consultation Note  Patient Name: Dorothy Haas Date: 06/22/2018   Pt showed up in the office with concerns about a painful right breast with lumps that started about a week ago. She has not tried any treatments. Pt reports lumps are to the top of the right breast. She reports she has noticed increase heat in the area and increasing pain over the last week.   Pt post delivery on 4/20 and is BF her son. Mom reports infant feeds about every hour for 10 minutes on one breast. Mom reports breast softening with feedings. She reports the lump seems more noticable after feeding than it is before feeding. She reports pain decreases after feeding.   Pt denies fever or flu like symptoms. She is concerned that the Nexplanon is causing the issues she is having. Discussed that Nexplanon is not the cause. Discussed that plugged ducts can be from inadequate emptying of the breast and or prolonged pressure in the area  Area without redness and lump was palpated and noted to be about a 1 cm x 2 cm thin/flattish area. LC had difficulty finding the area today in the office, Pt was able to point it out to [facility].   Reviewed plugged ducts and treatments for. Gave pt a manual pump and showed her not to use and clean. Pt is aware all milk should be fed to infant.   Discussed trying Sunflower Lecithin if she would like and also can use Tylenol or Ibuprofen as needed.   LC will call pt on Monday to assess how she is doing. Pt asked that [place] call between 12-1 when her husband is home for lunch.  Spoke with patient with assistance of Benedict Zayneb # [id number] and then was transferred to [place] # [id number] to finish visit.    Debby Freiberg Sharronda Schweers 06/22/2018, 11:45 AM

## 2018-06-22 NOTE — Telephone Encounter (Signed)
The patient visited our office stating she is in pain, she is also concerned about a bruise/lump on her breast and thinks it may effect breast feeding her child. She also stated she would like a follow up concerning the nexplannon. Informed the lactation consultant in regards of the breast feeding and scheduling the follow up appointment.

## 2018-06-22 NOTE — Patient Instructions (Addendum)
1. Offer infant the breast with each feeding, feed infant on the right breast with each feeding if possible otherwise please pump the breast or hand express the breast to help empty it 2. Massage the breast with feeding and pumping 3. Warm moist compresses to the effected area for 20 minutes before feeding infant or pumping, about every 2 hours 4. If infant does not feed on the right breast, Pump the right breast or hand express the breast to soften the area, try to empty the right breast at least every 2 hours. All milk can be fed to infant.  5. Call [phone] is not improved by Monday or if she gets a fever, flu like symptoms, or redness to the breast to rule out Mastitis 6. Sunflower Lecithin can be helpful if the plugs keep happening. The usual dosage is 1200 mg or 1 capsule 4 times a day (Breakfast, lunch, dinner and bedtime) and can be decreased to 2 x a day if the plugs stop happening. 7. Can take Ibuprofen for pain and swelling to the area or can take Tylenol for the pain 8. Keep up the good work 23. Call with any questions/concerns as needed [phone] 10. Thank you for allowing me to assist you today 11. Follow up with Lactation as needed

## 2018-06-26 ENCOUNTER — Telehealth: Payer: Self-pay | Admitting: Lactation Services

## 2018-06-26 ENCOUNTER — Other Ambulatory Visit (HOSPITAL_COMMUNITY): Payer: Self-pay | Admitting: Lactation Services

## 2018-06-26 ENCOUNTER — Telehealth (INDEPENDENT_AMBULATORY_CARE_PROVIDER_SITE_OTHER): Payer: Medicaid Other | Admitting: Lactation Services

## 2018-06-26 DIAGNOSIS — O9229 Other disorders of breast associated with pregnancy and the puerperium: Secondary | ICD-10-CM

## 2018-06-26 DIAGNOSIS — N644 Mastodynia: Secondary | ICD-10-CM

## 2018-06-26 DIAGNOSIS — O9279 Other disorders of lactation: Secondary | ICD-10-CM

## 2018-06-26 NOTE — Lactation Note (Signed)
Lactation Consultation Note  Patient Name: Dorothy Haas JYNWG'N Date: 06/26/2018   Opened in error  Maternal Data    Feeding    LATCH Score                   Interventions    Lactation Tools Discussed/Used     Consult Status      Donn Pierini 06/26/2018, 2:18 PM

## 2018-06-26 NOTE — Telephone Encounter (Signed)
Called pt as agreed to while in the office on Friday to follow up with lump to breast, presumed to be a plugged duct based on her symptoms.   Called pt with assistance of phone [place] interpreter Yousef # [phone].   Spoke with pt and she reports the pain in her upper right breast. She reports the pain and lumps are still present, pt reports pain is not worse and not better. She reports she has been feeding the infant at the breast and has been pumping afterwards. Mom reports infant will not take the bottle of pumped breast milk but is BF well. Mom reports infant is voiding and stooling well. Pt reports that she has been putting warm wet cloth on the breast before feeding the infant. She has been massaging the area with feeding.   Pt denies fever or flu like symptoms at this time. Pt was not able to find the Sunflower Lecithin at the pharmacy that she went to. Asked her if she knows where [employer] is located, she said no. Informed her [facility] or a [employer] would carry it or can order on [employer]. Pt asked if she can use another med, discussed there really is no other Med that has the same action. Pt voiced understanding.   Discussed I will need to speak with Physician and see if pt needs to be seen in person or virtually. Informed pt I will call them back with recommendation from MD.   Phone # is the husbands phone #. Asked pt if I can call the husband with the follow up from the doctor. Pt gave me her phone # to call her later after speaking with MD.  Pts phone # [phone]

## 2018-06-26 NOTE — Progress Notes (Unsigned)
Spoke with Dr. Roselie Awkward, Korea of right breast ordered.   Called the [facility] to schedule an Korea for mother to assess for abscess of the right upper breast. The [facility] would not schedule until Pre Auth is completed.

## 2018-06-27 ENCOUNTER — Telehealth: Payer: Self-pay | Admitting: Lactation Services

## 2018-06-27 ENCOUNTER — Telehealth (HOSPITAL_COMMUNITY): Payer: Self-pay | Admitting: Lactation Services

## 2018-06-27 NOTE — Telephone Encounter (Signed)
Called pt with assistance of [place] # [phone]. Pt did not answer and message was left for her to call the office to inform her that an Korea has been ordered and the [place] program will be calling her to help with scheduling appt.

## 2018-06-27 NOTE — Telephone Encounter (Addendum)
Spoke with the [place] and they report pt has Pregnancy Medicaid and will need to go through the [place] program to have Korea scheduled.   Sent email to [employer] at the [facility] to get in touch with patient to schedule appt.

## 2018-08-21 ENCOUNTER — Ambulatory Visit: Payer: Medicaid Other | Admitting: Interventional Cardiology

## 2019-09-08 IMAGING — US US MFM OB COMP +14 WKS
1 series · 14 of 28 positions shown · non-contrast
Comparison: none

[Series 1: us mfm ob comp +14 wks · 92 acquisitions, 14 frames shown]
[im 4/92]
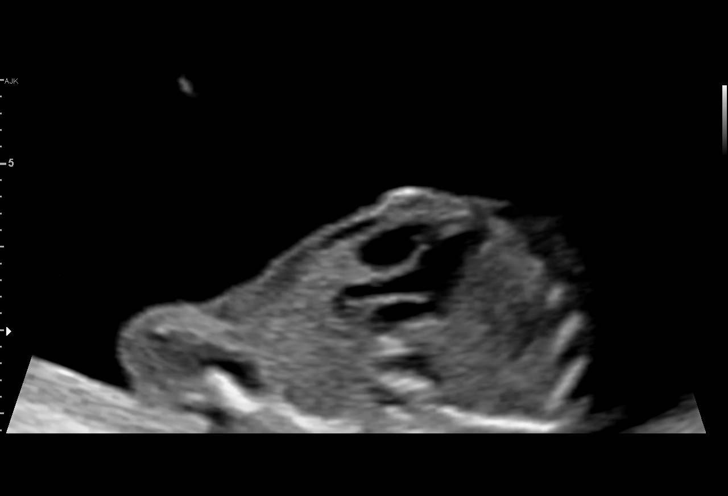
[im 11/92]
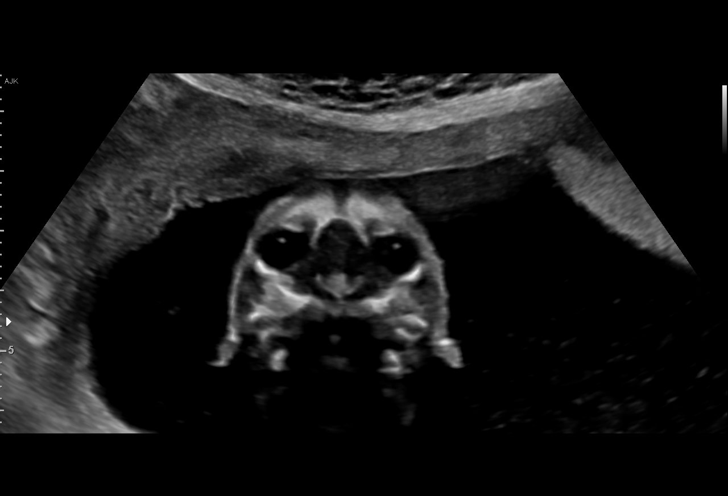
[im 17/92]
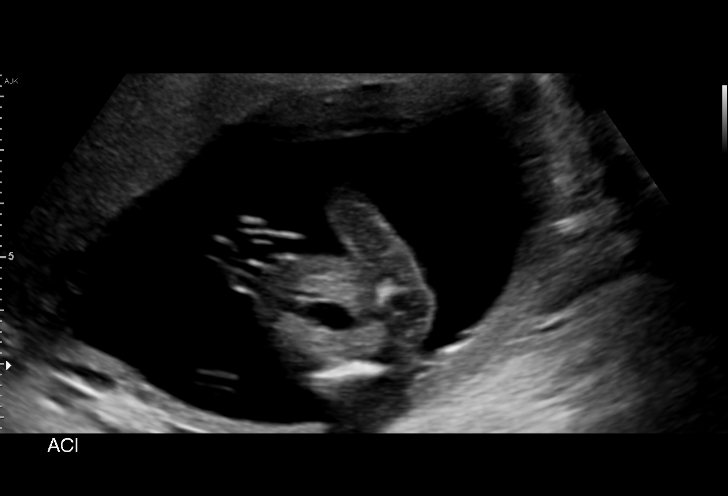
[im 24/92]
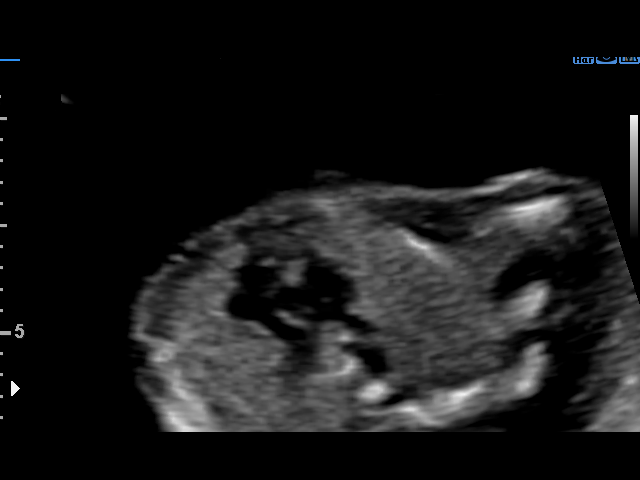
[im 31/92]
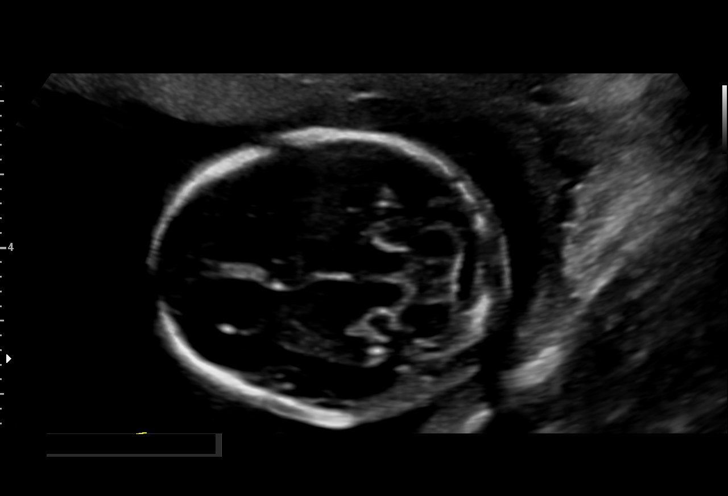
[im 38/92]
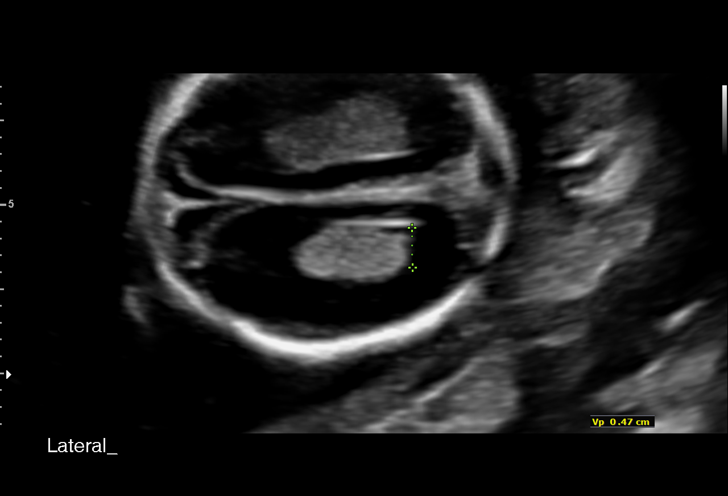
[im 44/92]
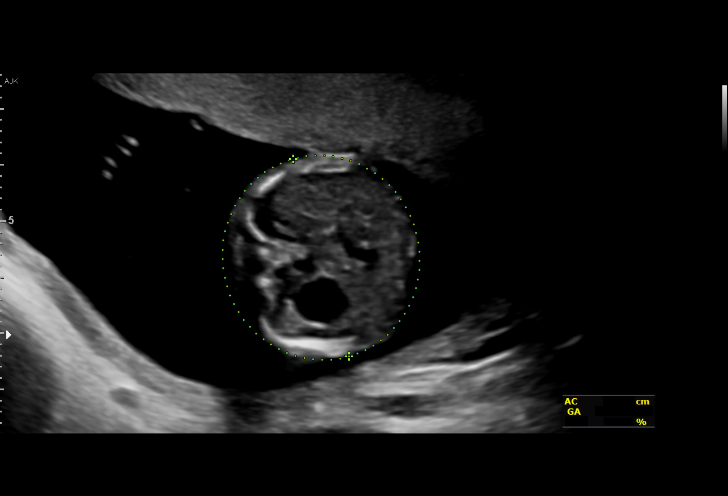
[im 51/92]
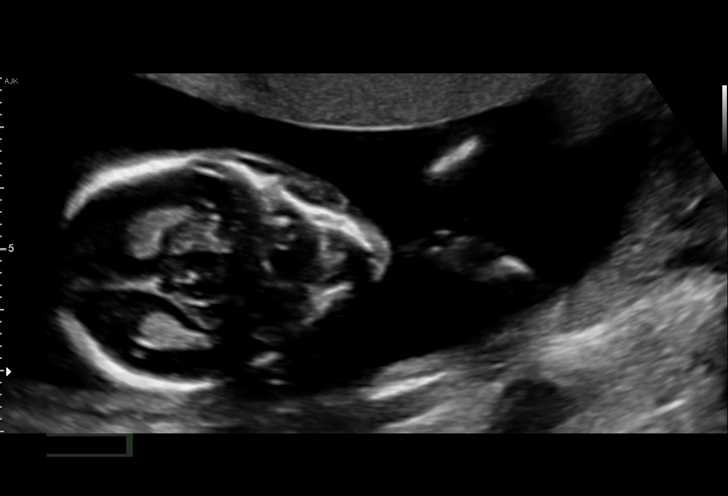
[im 58/92]
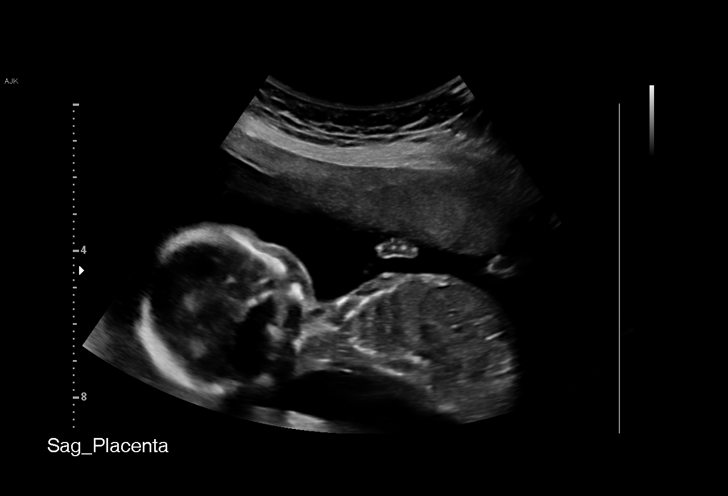
[im 65/92]
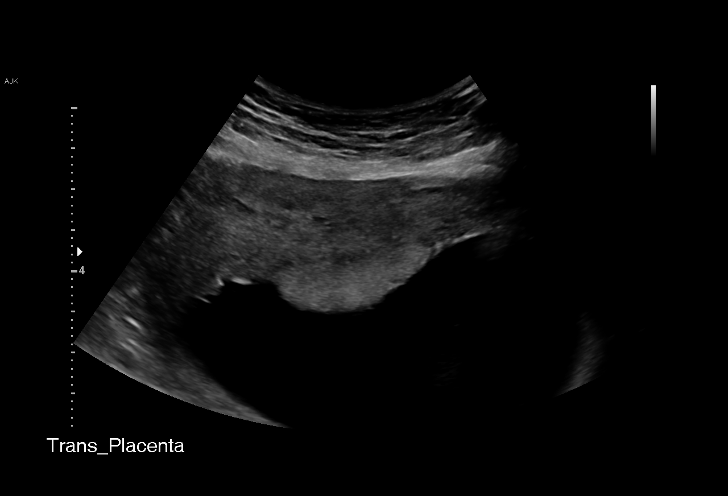
[im 71/92]
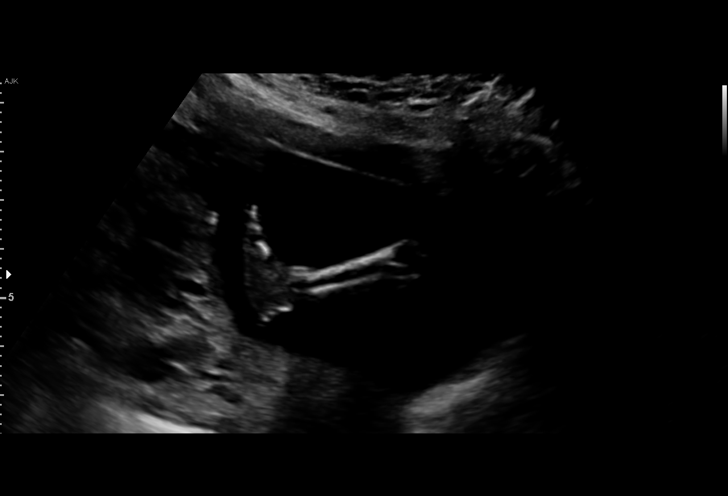
[im 78/92]
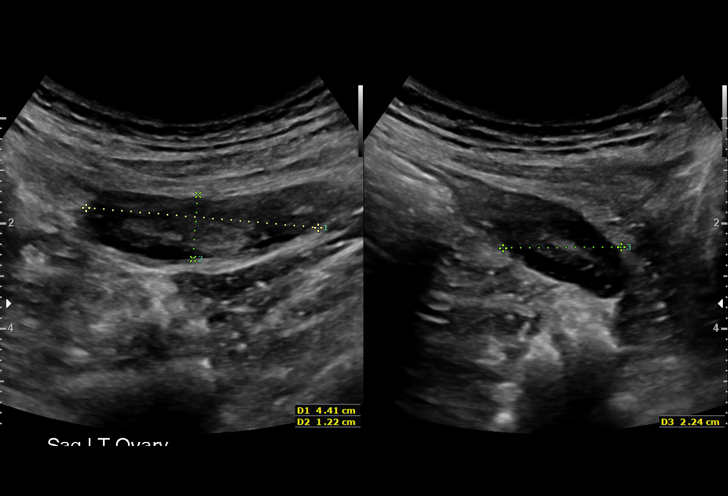
[im 85/92]
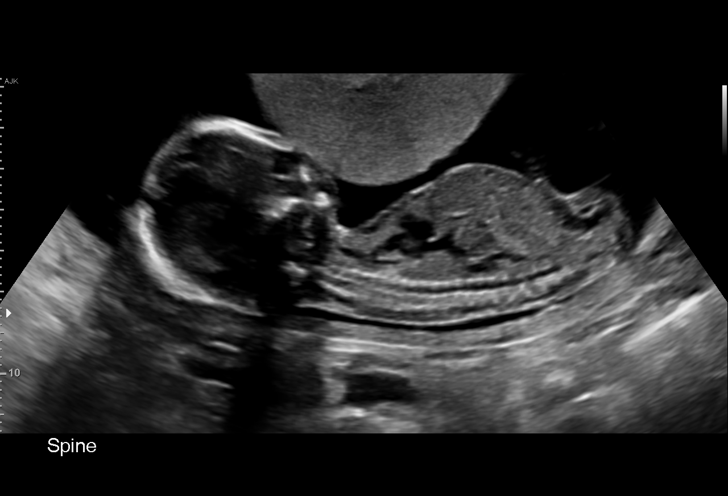
[im 92/92]
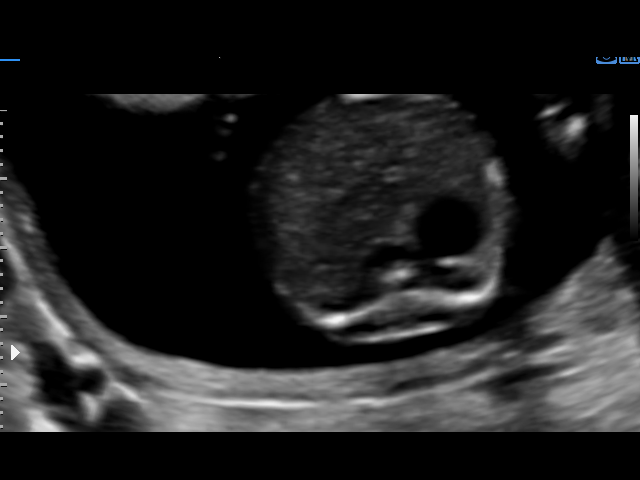

[14 of 28 positions shown; findings below may reference images not displayed]

[REDACTED]. [HOSPITAL],
                   RAHEEL CNM

 ----------------------------------------------------------------------

 ----------------------------------------------------------------------
Indications

  Encounter for antenatal screening for
  malformations (low risk NIPS)
  Previous cesarean delivery, antepartum (x2)
  18 weeks gestation of pregnancy
 ----------------------------------------------------------------------
Vital Signs

 BMI:
Fetal Evaluation

 Num Of Fetuses:          1
 Fetal Heart Rate(bpm):   149
 Cardiac Activity:        Observed
 Presentation:            Breech
 Placenta:                Anterior
 P. Cord Insertion:       Visualized, central

 Amniotic Fluid
 AFI FV:      Within normal limits

                             Largest Pocket(cm)

Biometry

 BPD:      37.9  mm     G. Age:  17w 4d         20  %    CI:        75.51   %    70 - 86
                                                         FL/HC:       17.1  %    15.8 - 18
 HC:      138.3  mm     G. Age:  17w 2d          6  %    HC/AC:       1.19       1.07 -
 AC:      116.4  mm     G. Age:  17w 3d         20  %    FL/BPD:      62.3  %
 FL:       23.6  mm     G. Age:  17w 1d          9  %    FL/AC:       20.3  %    20 - 24
 HUM:      22.2  mm     G. Age:  16w 5d          9  %
 CER:      17.3  mm     G. Age:  17w 2d         25  %
 NFT:       3.6  mm

 LV:        4.7  mm
 CM:        2.2  mm

 Est. FW:     188   gm     0 lb 7 oz     26  %
OB History

 Gravidity:    3         Term:   2
Gestational Age

 LMP:           18w 2d        Date:  08/01/17                 EDD:   05/08/18
 U/S Today:     17w 3d                                        EDD:   05/14/18
 Best:          18w 2d     Det. By:  LMP  (08/01/17)          EDD:   05/08/18
Anatomy

 Cranium:               Appears normal         LVOT:                   Appears normal
 Cavum:                 Appears normal         Aortic Arch:            Appears normal
 Ventricles:            Appears normal         Ductal Arch:            Appears normal
 Choroid Plexus:        Appears normal         Diaphragm:              Appears normal
 Cerebellum:            Appears normal         Stomach:                Appears normal, left
                                                                       sided
 Posterior Fossa:       Appears normal         Abdomen:                Appears normal
 Nuchal Fold:           Appears normal         Abdominal Wall:         Appears nml (cord
                                                                       insert, abd wall)
 Face:                  Appears normal         Cord Vessels:           Appears normal (3
                        (orbits and profile)                           vessel cord)
 Lips:                  Appears normal         Kidneys:                Appear normal
 Palate:                Appears normal         Bladder:                Appears normal
 Thoracic:              Appears normal         Spine:                  Appears normal
 Heart:                 Appears normal         Upper Extremities:      Appears normal
                        (4CH, axis, and
                        situs)
 RVOT:                  Appears normal         Lower Extremities:      Appears normal

 Other:  Fetus appears to be female. Heels and 5th digit visualized. Nasal
         bone visualized. Open hands visualized. Technically difficult due to
         excessive fetal movement.
Cervix Uterus Adnexa

 Cervix
 Length:           3.22  cm.
 Normal appearance by transabdominal scan.

 Uterus
 No abnormality visualized.

 Left Ovary
 Within normal limits.

 Right Ovary
 Within normal limits.

 Adnexa
 No abnormality visualized.
Impression

 Normal interval growth.  No ultrasonic evidence of structural
 fetal anomalies.
Recommendations

 Follow up as clinically indicated.

## 2020-04-20 IMAGING — US US OB COMP LESS 14 WK
1 series · 14 of 28 positions shown · non-contrast
Comparison: None.

CLINICAL DATA: Two week history of pelvic pain; quantitative beta
HCG of 248,645

EXAM:
OBSTETRIC <14 WK ULTRASOUND
TECHNIQUE: Transabdominal ultrasound was performed for evaluation of the
gestation as well as the maternal uterus and adnexal regions.

[Series 1: us ob comp less 14 wk · 0.19mm/px · 61 acquisitions, 14 frames shown]
[im 3/61]
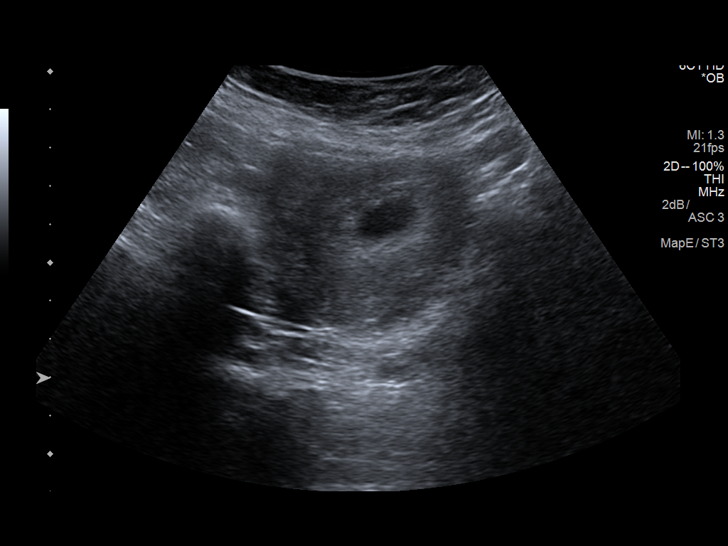
[im 7/61]
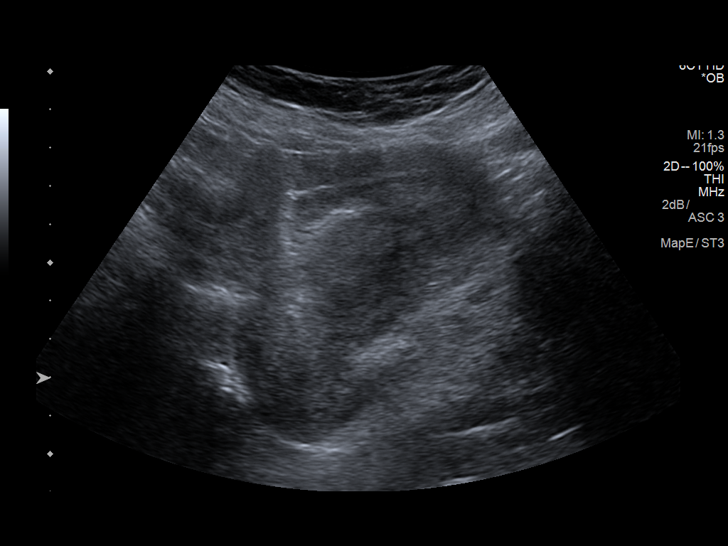
[im 12/61]
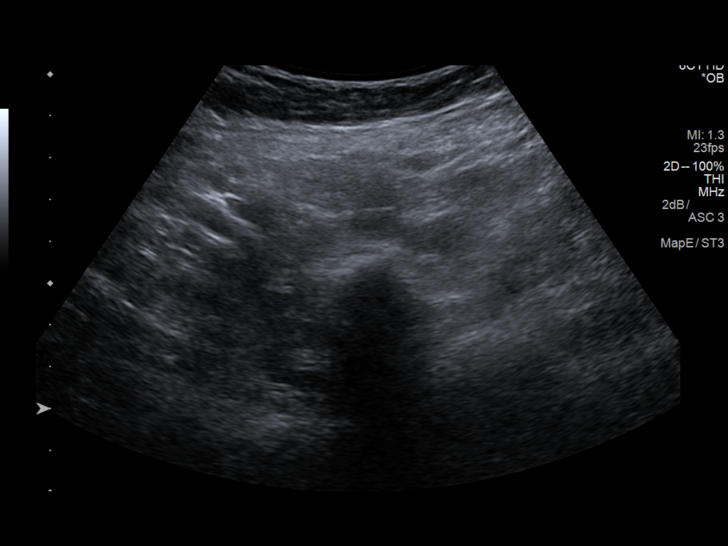
[im 16/61]
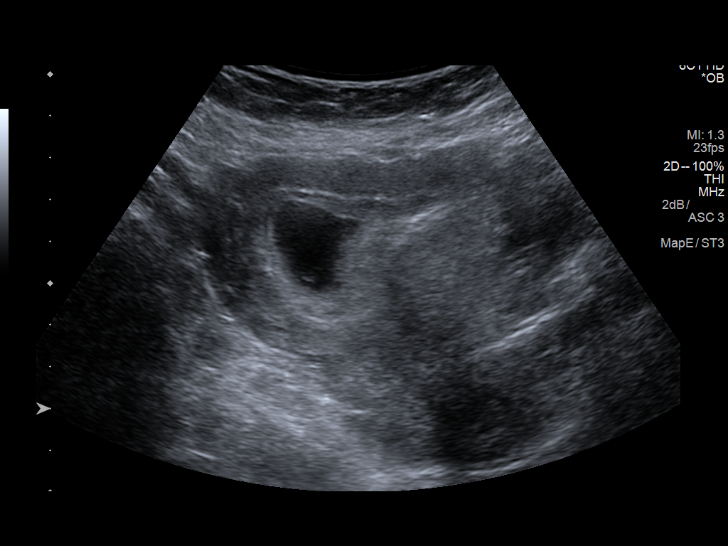
[im 21/61]
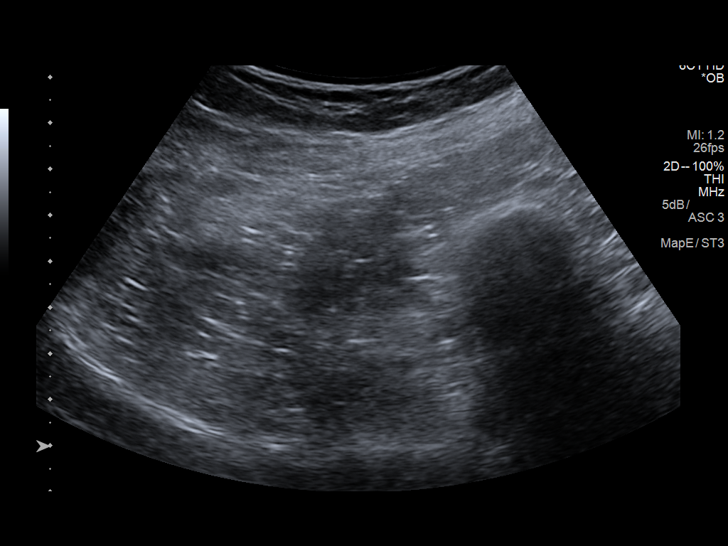
[im 25/61]
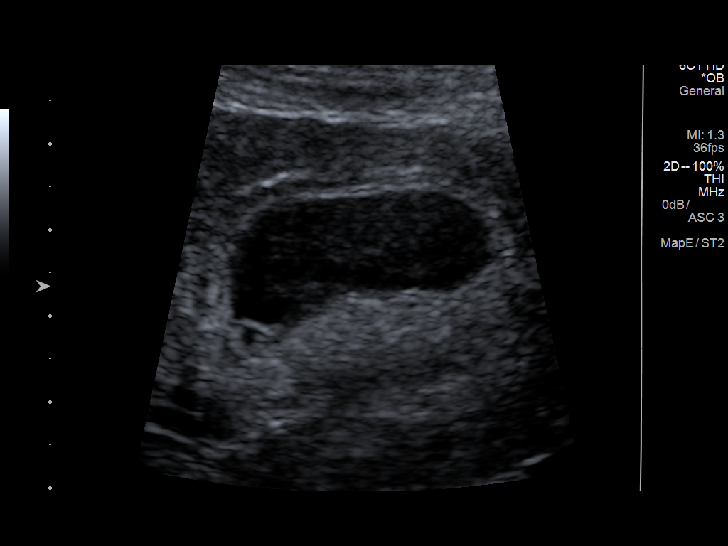
[im 29/61]
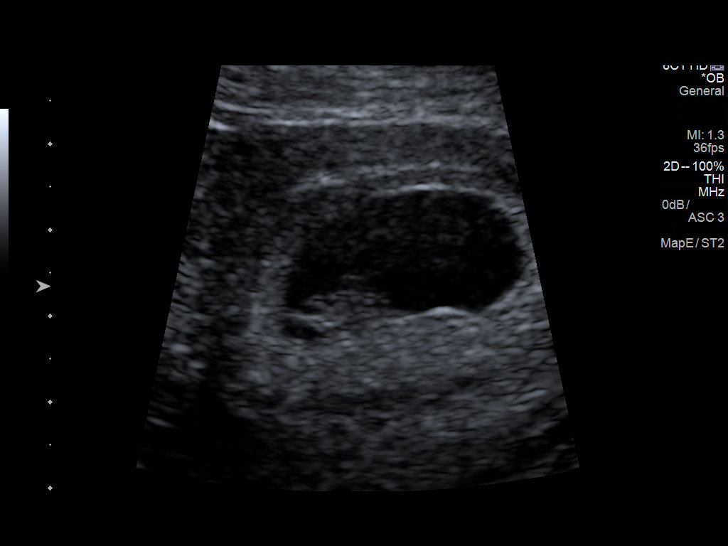
[im 34/61]
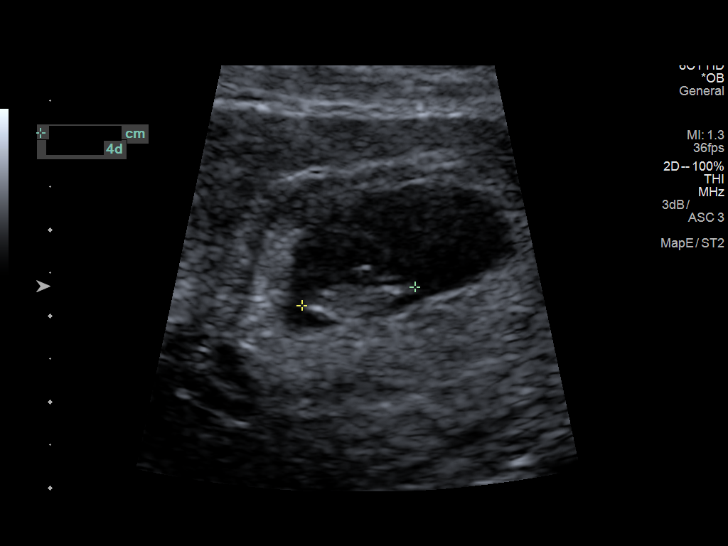
[im 38/61]
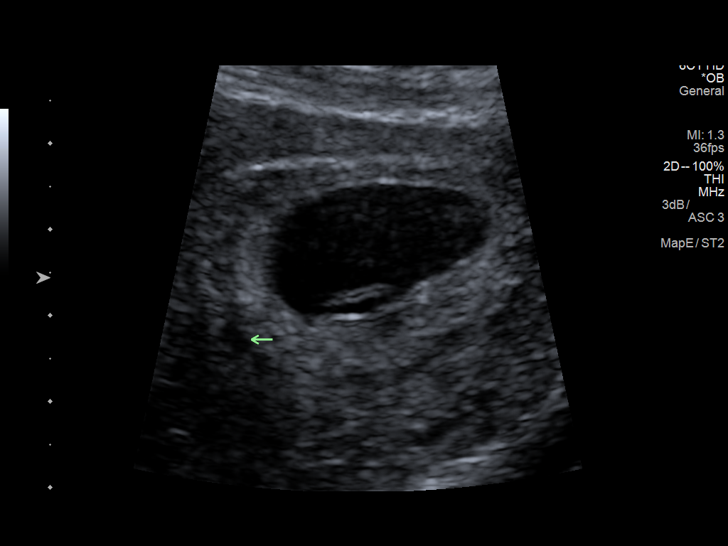
[im 43/61]
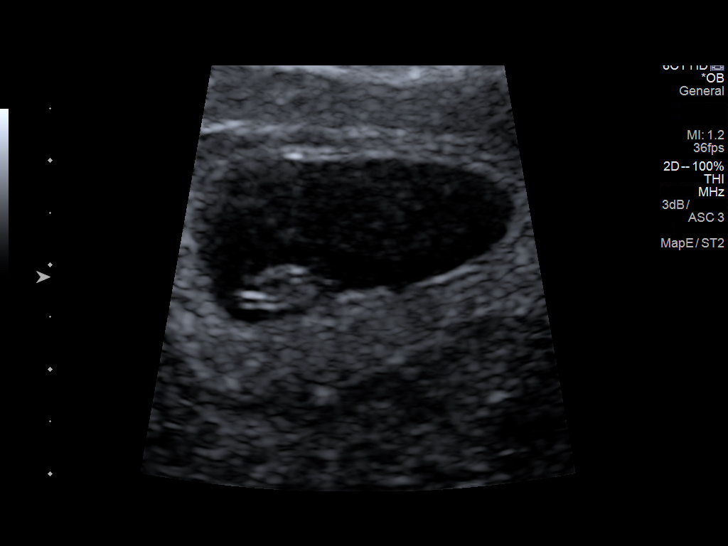
[im 47/61]
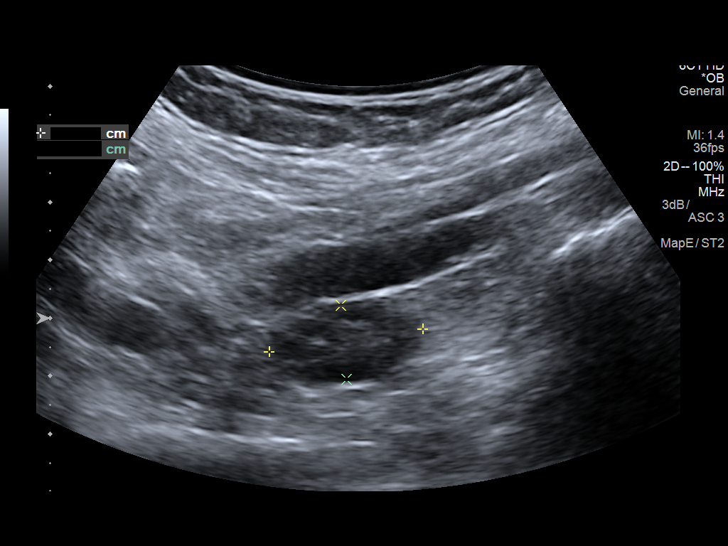
[im 52/61]
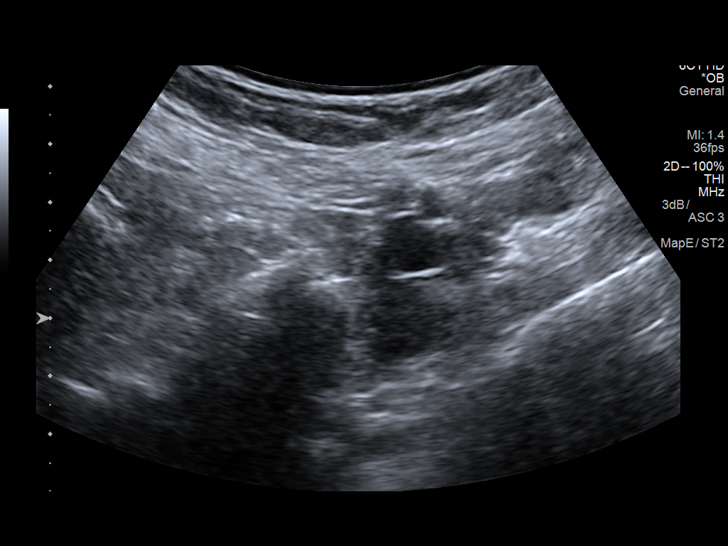
[im 56/61]
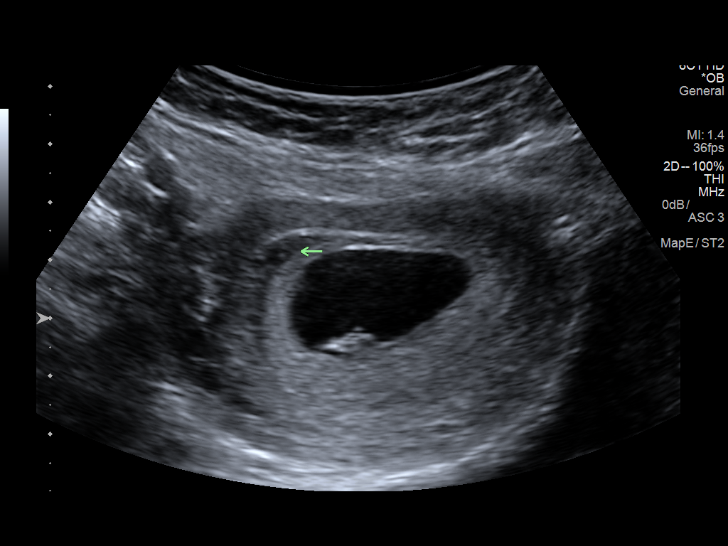
[im 61/61]
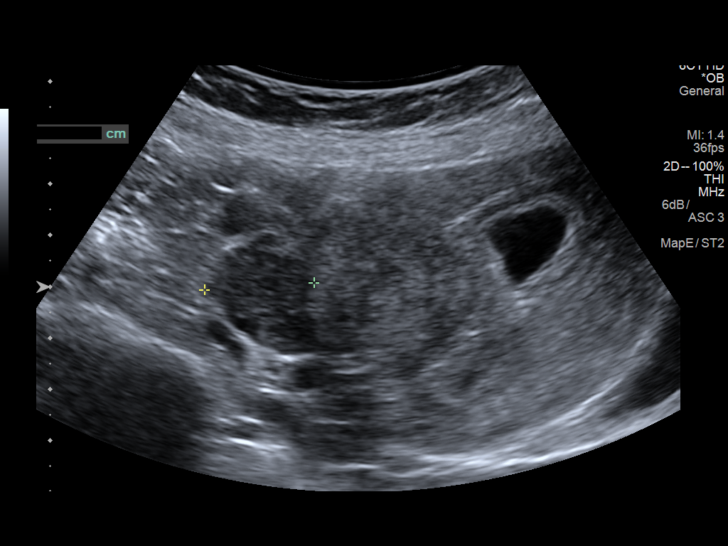

[14 of 28 positions shown; findings below may reference images not displayed]

FINDINGS: Intrauterine gestational sac: Single

Yolk sac:  Present

Embryo:  Present

Cardiac Activity: Present

Heart Rate: 140 bpm

CRL:   13.4 mm   7 w 4 d                  US EDC: May 12, 2018

Subchorionic hemorrhage: There is a small subchorionic hemorrhage.

Maternal uterus/adnexae: The uterus and adnexal structures exhibit
no abnormalities.
IMPRESSION: Single viable IUP with estimated gestational age of 7 weeks 4 days
with estimated date of confinement May 12, 2018.

There is a small subchorionic hemorrhage.

## 2020-06-19 NOTE — Telephone Encounter (Signed)
Opened in error

## 2021-06-26 ENCOUNTER — Ambulatory Visit: Payer: BLUE CROSS/BLUE SHIELD | Admitting: Certified Nurse Midwife

## 2021-06-26 ENCOUNTER — Encounter: Payer: Self-pay | Admitting: Certified Nurse Midwife

## 2021-06-26 VITALS — BP 125/84 | HR 87 | Ht 63.0 in | Wt 125.0 lb

## 2021-06-26 DIAGNOSIS — Z3046 Encounter for surveillance of implantable subdermal contraceptive: Secondary | ICD-10-CM

## 2021-06-26 MED ORDER — PRENATAL VITAMIN 27-0.8 MG PO TABS: 1.0000 | ORAL_TABLET | Freq: Every day | ORAL | 11 refills | Status: DC

## 2021-06-26 NOTE — Progress Notes (Signed)
   GYNECOLOGY OFFICE VISIT NOTE  History:  31 y.o. [id number] here today for Nexpalnon removal. She denies any abnormal vaginal discharge, bleeding, pelvic pain or other concerns.   Past Medical History:  Diagnosis Date   ?Hyperthyroidism 10/13/2017   [x]  rpt TFTs @ 20wks Results for JASPREET, BODNER (MRN Vickie Epley) as of 12/23/2017 13:18  Ref. Range 12/06/2017 10:51 TSH Latest Ref Range: 0.450 - 4.500 uIU/mL <0.006 (L) Triiodothyronine (T3) Latest Ref Range: 71 - 180 ng/dL 12/08/2017 315) Latest Ref Range: 0.82 - 1.77 ng/dL V7,OHYW(VPXTGG  Per Dr. 2.69: No meds needed. Recheck in about 6 weeks.     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 Both c-sections done in 10/12/2017.    Ptyalism 10/14/2017   Racing heart beat 02/02/2018   Transaminitis 10/12/2017   Resolved. likely 2/2 Hyperemesis    Past Surgical History:  Procedure Laterality Date   CESAREAN SECTION     x 2   CESAREAN SECTION N/A 05/01/2018   Procedure: CESAREAN SECTION;  Surgeon: 05/03/2018, MD;  Location: [facility] LD [facility]S;  Service: Obstetrics;  Laterality: N/A;    The following portions of the patient's history were reviewed and updated as appropriate: allergies, current medications, past family history, past medical history, past social history, past surgical history and problem list.   Health Maintenance:  Normal pap and negative HRHPV on 10/10/17.    Review of Systems:  Negative except noted in HPI  Objective:  Physical Exam BP 125/84   Pulse 87   Ht 5\' 3"  (1.6 m)   Wt 125 lb (56.7 kg)   BMI 22.14  kg/m  CONSTITUTIONAL: Well-developed, well-nourished female in no acute distress.  HENT:  Normocephalic, atraumatic EYES: Conjunctivae and EOM are normal NECK: Normal range of motion SKIN: Skin is warm and dry NEUROLOGIC: Alert and oriented to person, place, and time PSYCHIATRIC: Normal mood and affect CARDIOVASCULAR: Normal heart rate noted RESPIRATORY: Effort and rate normal MUSCULOSKELETAL: Normal range of motion  Labs and Imaging No results found for this or any previous visit (from the past 24 hour(s)).  Assessment & Plan:   1. Nexplanon removal   -see procedure note  Follow up for annual and papsmear Start PNV- Rx sent  Interpreter present for encounter 10/12/17, 06/26/2021 10:18 AM

## 2021-06-26 NOTE — Procedures (Signed)
GYNECOLOGY PROCEDURE NOTE  Dorothy Haas is a 31 y.o. [phone] here for Nexplanon removal. No other gynecologic concerns. Pt planning to become pregnant in a couple months.   Nexplanon Removal Patient identified, informed consent performed, consent signed.   Appropriate time out taken. Nexplanon site identified.  Area prepped in usual sterile fashon. One ml of 1% lidocaine was used to anesthetize the area at the distal end of the implant. A small stab incision was made right beside the implant on the distal portion.  The Nexplanon rod was grasped using hemostats and removed without difficulty.  There was minimal blood loss. There were no complications.  3 ml of 1% lidocaine was injected around the incision for post-procedure analgesia.  Steri-strips were applied over the small incision.  A pressure bandage was applied to reduce any bruising.  The patient tolerated the procedure well and was given post procedure instructions.  Patient is planning attempt conception.  Donette Larry, CNM [facility] [employer], [facility]

## 2022-02-08 ENCOUNTER — Other Ambulatory Visit (HOSPITAL_COMMUNITY): Admit: 2022-02-08 | Payer: BLUE CROSS/BLUE SHIELD

## 2022-02-08 ENCOUNTER — Ambulatory Visit: Payer: BLUE CROSS/BLUE SHIELD | Admitting: Obstetrics and Gynecology

## 2022-02-08 ENCOUNTER — Encounter: Payer: Self-pay | Admitting: Obstetrics and Gynecology

## 2022-02-08 VITALS — BP 139/87 | HR 83 | Ht 63.0 in | Wt 131.0 lb

## 2022-02-08 DIAGNOSIS — N938 Other specified abnormal uterine and vaginal bleeding: Secondary | ICD-10-CM

## 2022-02-08 DIAGNOSIS — Z603 Acculturation difficulty: Secondary | ICD-10-CM

## 2022-02-08 DIAGNOSIS — Z789 Other specified health status: Secondary | ICD-10-CM

## 2022-02-08 DIAGNOSIS — N939 Abnormal uterine and vaginal bleeding, unspecified: Secondary | ICD-10-CM

## 2022-02-08 LAB — POCT URINE PREGNANCY: Preg Test, Ur: NEGATIVE

## 2022-02-08 NOTE — Progress Notes (Signed)
   RETURN GYNECOLOGY VISIT  Subjective:  Dorothy Haas is a 32 y.o. [id number] with LMP 01/17/22 presenting for irregular periods  Nexplanon removed 07/2021. Since then, has had frequent bleeding and intermenstrual spotting. No HVB. Is bleeding 3 weeks out of the month, more like dark brown or light pink spotting. No other medications. No post coital bleeding.  Is not actively TTC but would welcome pregnancy.   Has hx thyroid disease & prior CS x3 Last pap 2019 NILM - due for pap  Objective:   Vitals:   02/08/22 0940  BP: 139/87  Pulse: 83  Weight: 131 lb (59.4 kg)  Height: 5\' 3"  (1.6 m)   General:  Alert, oriented and cooperative. Patient is in no acute distress.  Skin: Skin is warm and dry. No rash noted.   Cardiovascular: Normal heart rate noted  Respiratory: Normal respiratory effort, no problems with respiration noted  Declined pelvic exam.  Assessment and Plan:  [name] is a 32 y.o. with AUB  1. Abnormal uterine bleeding We reviewed a focused differential diagnosis for AUB that includes, but is not limited to anovulatory cycles (menopausal transition/POI, PCOS, hypothalamic hypogonadism), endometrial/endocervical polyps, infection (e.g., endometritis), isthmocele/cesarean scar defect, medication side effect (blood thinners, contraceptives, some antipsychotics), and thyroid disease. Most likely is medication effect, however exact etiology of her symptoms unclear at this time.  Recommended pelvic exam for pap, GC/CT/trich and assessment of cervix, but she declines today. - POCT urine pregnancy - Urine cytology ancillary only() - TSH - US PELVIC COMPLETE WITH TRANSVAGINAL; Future Will RTC after testing. Discussed recommendation for pelvic exam at that time.  2. Language barrier Patient interviewed and counseled with in person Arabic interpreter  Return in about 1 month (around 03/11/2022) for results review of labs and  ultrasound.  Future Appointments  Date Time Provider [place]  02/10/2022 10:00 AM [facility]- [place] 1 [facility]  03/10/2022  9:30 AM Inez Catalina, MD [facility]   Inez Catalina, MD

## 2022-02-09 LAB — TSH: TSH: 1.51 u[IU]/mL (ref 0.450–4.500)

## 2022-02-09 LAB — URINE CYTOLOGY ANCILLARY ONLY
Chlamydia: NEGATIVE
Comment: NEGATIVE
Comment: NORMAL
Neisseria Gonorrhea: NEGATIVE

## 2022-02-10 ENCOUNTER — Ambulatory Visit (INDEPENDENT_AMBULATORY_CARE_PROVIDER_SITE_OTHER): Payer: BLUE CROSS/BLUE SHIELD

## 2022-02-10 DIAGNOSIS — N939 Abnormal uterine and vaginal bleeding, unspecified: Secondary | ICD-10-CM

## 2022-03-10 ENCOUNTER — Ambulatory Visit (INDEPENDENT_AMBULATORY_CARE_PROVIDER_SITE_OTHER): Payer: BLUE CROSS/BLUE SHIELD | Admitting: Obstetrics and Gynecology

## 2022-03-10 ENCOUNTER — Encounter: Payer: Self-pay | Admitting: Obstetrics and Gynecology

## 2022-03-10 ENCOUNTER — Other Ambulatory Visit (HOSPITAL_COMMUNITY)
Admission: RE | Admit: 2022-03-10 | Discharge: 2022-03-10 | Disposition: A | Discharge status: Home / Self Care | Payer: BLUE CROSS/BLUE SHIELD | Source: Ambulatory Visit | Attending: Obstetrics and Gynecology | Admitting: Obstetrics and Gynecology

## 2022-03-10 VITALS — BP 124/91 | HR 89 | Resp 16 | Ht 63.0 in | Wt 131.0 lb

## 2022-03-10 DIAGNOSIS — N923 Ovulation bleeding: Secondary | ICD-10-CM | POA: Diagnosis not present

## 2022-03-10 DIAGNOSIS — Z124 Encounter for screening for malignant neoplasm of cervix: Secondary | ICD-10-CM | POA: Diagnosis present

## 2022-03-10 NOTE — Patient Instructions (Signed)
We will call you with a surgery date

## 2022-03-10 NOTE — Progress Notes (Signed)
   RETURN GYNECOLOGY VISIT  Subjective:  Dorothy Haas is a 32 y.o. [phone] with LMP 02/17/22 presenting for follow up of intermenstrual bleeding.  Last seen 02/08/22 reporting intermenstrual bleeding since her Nexplanon was removed 7 months ago. She declined a pelvic exam at that time.  I personally reviewed the results of the following tests ordered from that visit: 02/08/22 UPT negative 02/08/22 TSH 1.51 02/08/22 Urine GC/CT negative 02/10/22 Pelvic ultrasound: uterus 9.3 x 3.7 x 5.5cm w/ EL 9.53m, CS scar visualized, normal ovaries  She presents today for results review, pelvic exam & discussion of next steps. Reports that her bleeding pattern is unchanged.   Is not actively TTC but would welcome pregnancy if it occurs. Is not using contraception.  Has hx thyroid disease & prior CS x3 Last pap 2019 NILM - due for pap  Objective:   Vitals:   03/10/22 0919  BP: (!) 124/91  Pulse: 89  Resp: 16  Weight: 131 lb (59.4 kg)  Height: 5' 3"$  (1.6 m)   General:  Alert, oriented and cooperative. Patient is in no acute distress.  Skin: Skin is warm and dry. No rash noted.   Cardiovascular: Normal heart rate noted  Respiratory: Normal respiratory effort, no problems with respiration noted  Abdomen: Soft, non-tender, non-distended   Pelvic: Vulva, vagina and cervix visually normal. No cervical polyp. Brown discharge present in vaginal vault.   Exam performed in the presence of a chaperone  Assessment and Plan:  SLarice Baillargeonis a 32y.o. with intermenstrual spotting/AUB  Intermenstrual spotting/AUB 2. Screening for cervical cancer Exam unremarkable today. Workup thus far has been unremarkable. Pap collected Discussed sonohysterogram to evaluate for polyp vs hysteroscopy D&C for sampling, cavity evaluation & removal of polyp. She strongly prefers hysteroscopy D&C.  Reviewed risks of hsc D&C including but not limited to pain, infection, bleeding, injury to uterus &  surrounding structures as well as medical complications of surgery/anesthesia. Reviewed recovery & post op restrictions - Cytology - PAP( [place])  3. Language barrier Patient interviewed and counseled with Arabic interpreter  Will route message to schedule hsc D&C  KInez Catalina MD

## 2022-03-12 LAB — CYTOLOGY - PAP
Comment: NEGATIVE
Diagnosis: NEGATIVE
High risk HPV: NEGATIVE

## 2022-03-15 NOTE — Addendum Note (Signed)
Addended by: Gale Journey on: 03/15/2022 08:14 AM   Modules accepted: Orders

## 2022-03-20 NOTE — Progress Notes (Signed)
Pre op orders entered

## 2022-03-25 ENCOUNTER — Encounter (HOSPITAL_BASED_OUTPATIENT_CLINIC_OR_DEPARTMENT_OTHER): Payer: Self-pay | Admitting: Obstetrics and Gynecology

## 2022-03-25 NOTE — Progress Notes (Signed)
Spoke w/ via phone for pre-op interview--- pt thru [place] interpreter ID # [phone] Lab needs dos----   t&s, urine preg            Lab results------ no COVID test -----patient states asymptomatic no test needed Arrive at ------- [id number] on 03-31-2022 NPO after MN NO Solid Food.  Clear liquids from MN until--- 1100 Med rec completed Medications to take morning of surgery ----- none Diabetic medication ----- n/a Patient instructed no nail polish to be worn day of surgery Patient instructed to bring photo id and insurance card day of surgery Patient aware to have Driver (ride ) / caregiver    for 24 hours after surgery -- husband, osama Patient Special Instructions ----- n/a Pre-Op special Istructions ----- pt requested female Arabic interpreter.  Sent via email to [place] interpreting , copy w/ chart. Pt has requested only female to take care of her.  Patient verbalized understanding of instructions that were given at this phone interview. Patient denies shortness of breath, chest pain, fever, cough at this phone interview.

## 2022-03-31 ENCOUNTER — Ambulatory Visit (HOSPITAL_BASED_OUTPATIENT_CLINIC_OR_DEPARTMENT_OTHER)
Admission: RE | Admit: 2022-03-31 | Discharge: 2022-03-31 | Disposition: A | Discharge status: Home / Self Care | Payer: BLUE CROSS/BLUE SHIELD | Source: Ambulatory Visit | Attending: Obstetrics and Gynecology | Admitting: Obstetrics and Gynecology

## 2022-03-31 ENCOUNTER — Ambulatory Visit (HOSPITAL_BASED_OUTPATIENT_CLINIC_OR_DEPARTMENT_OTHER): Payer: BLUE CROSS/BLUE SHIELD

## 2022-03-31 ENCOUNTER — Encounter (HOSPITAL_BASED_OUTPATIENT_CLINIC_OR_DEPARTMENT_OTHER): Payer: Self-pay | Admitting: Obstetrics and Gynecology

## 2022-03-31 ENCOUNTER — Other Ambulatory Visit: Payer: Self-pay

## 2022-03-31 ENCOUNTER — Encounter (HOSPITAL_BASED_OUTPATIENT_CLINIC_OR_DEPARTMENT_OTHER)
Admission: RE | Disposition: A | Discharge status: Home / Self Care | Payer: Self-pay | Source: Ambulatory Visit | Attending: Obstetrics and Gynecology

## 2022-03-31 DIAGNOSIS — N939 Abnormal uterine and vaginal bleeding, unspecified: Secondary | ICD-10-CM | POA: Insufficient documentation

## 2022-03-31 DIAGNOSIS — N923 Ovulation bleeding: Secondary | ICD-10-CM

## 2022-03-31 DIAGNOSIS — N920 Excessive and frequent menstruation with regular cycle: Secondary | ICD-10-CM

## 2022-03-31 DIAGNOSIS — Z01818 Encounter for other preprocedural examination: Secondary | ICD-10-CM

## 2022-03-31 DIAGNOSIS — Z9079 Acquired absence of other genital organ(s): Secondary | ICD-10-CM | POA: Insufficient documentation

## 2022-03-31 HISTORY — PX: HYSTEROSCOPY WITH D & C: SHX1775

## 2022-03-31 HISTORY — DX: Abnormal uterine and vaginal bleeding, unspecified: N93.9

## 2022-03-31 LAB — TYPE AND SCREEN
ABO/RH(D): O POS
Antibody Screen: NEGATIVE

## 2022-03-31 LAB — POCT PREGNANCY, URINE: Preg Test, Ur: NEGATIVE

## 2022-03-31 SURGERY — DILATATION AND CURETTAGE /HYSTEROSCOPY
Anesthesia: General | Site: Uterus

## 2022-03-31 MED ORDER — ONDANSETRON HCL 4 MG/2ML IJ SOLN
INTRAMUSCULAR | Status: DC | PRN
  Administered 2022-03-31: 4 mg via INTRAVENOUS

## 2022-03-31 MED ORDER — SODIUM CHLORIDE 0.9 % IR SOLN
Status: DC | PRN
  Administered 2022-03-31: 3000 mL

## 2022-03-31 MED ORDER — LACTATED RINGERS IV SOLN: INTRAVENOUS | Status: DC

## 2022-03-31 MED ORDER — KETOROLAC TROMETHAMINE 30 MG/ML IJ SOLN
INTRAMUSCULAR | Status: AC
  Filled 2022-03-31: qty 1

## 2022-03-31 MED ORDER — SILVER NITRATE-POT NITRATE 75-25 % EX MISC
CUTANEOUS | Status: DC | PRN
  Administered 2022-03-31: 1

## 2022-03-31 MED ORDER — FENTANYL CITRATE (PF) 100 MCG/2ML IJ SOLN
INTRAMUSCULAR | Status: DC | PRN
  Administered 2022-03-31: 50 ug via INTRAVENOUS
  Administered 2022-03-31: 25 ug via INTRAVENOUS

## 2022-03-31 MED ORDER — FENTANYL CITRATE (PF) 100 MCG/2ML IJ SOLN: 25.0000 ug | INTRAMUSCULAR | Status: DC | PRN

## 2022-03-31 MED ORDER — OXYCODONE HCL 5 MG/5ML PO SOLN: 5.0000 mg | Freq: Once | ORAL | Status: DC | PRN

## 2022-03-31 MED ORDER — PROPOFOL 10 MG/ML IV BOLUS
INTRAVENOUS | Status: DC | PRN
  Administered 2022-03-31: 150 mg via INTRAVENOUS

## 2022-03-31 MED ORDER — MEPERIDINE HCL 25 MG/ML IJ SOLN: 6.2500 mg | INTRAMUSCULAR | Status: DC | PRN

## 2022-03-31 MED ORDER — OXYCODONE HCL 5 MG PO TABS: 5.0000 mg | ORAL_TABLET | Freq: Once | ORAL | Status: DC | PRN

## 2022-03-31 MED ORDER — ACETAMINOPHEN 500 MG PO TABS: 1000.0000 mg | ORAL_TABLET | Freq: Once | ORAL | Status: DC

## 2022-03-31 MED ORDER — FENTANYL CITRATE (PF) 100 MCG/2ML IJ SOLN
INTRAMUSCULAR | Status: AC
  Filled 2022-03-31: qty 2

## 2022-03-31 MED ORDER — MIDAZOLAM HCL 2 MG/2ML IJ SOLN
INTRAMUSCULAR | Status: AC
  Filled 2022-03-31: qty 2

## 2022-03-31 MED ORDER — MIDAZOLAM HCL 2 MG/2ML IJ SOLN
INTRAMUSCULAR | Status: DC | PRN
  Administered 2022-03-31: 1 mg via INTRAVENOUS

## 2022-03-31 MED ORDER — LIDOCAINE HCL (CARDIAC) PF 100 MG/5ML IV SOSY
PREFILLED_SYRINGE | INTRAVENOUS | Status: DC | PRN
  Administered 2022-03-31: 40 mg via INTRAVENOUS

## 2022-03-31 MED ORDER — MIDAZOLAM HCL 2 MG/2ML IJ SOLN: 0.5000 mg | Freq: Once | INTRAMUSCULAR | Status: DC | PRN

## 2022-03-31 MED ORDER — ACETAMINOPHEN 500 MG PO TABS
ORAL_TABLET | ORAL | Status: AC
  Filled 2022-03-31: qty 2

## 2022-03-31 MED ORDER — DEXAMETHASONE SODIUM PHOSPHATE 4 MG/ML IJ SOLN
INTRAMUSCULAR | Status: DC | PRN
  Administered 2022-03-31: 10 mg via INTRAVENOUS

## 2022-03-31 MED ORDER — KETOROLAC TROMETHAMINE 30 MG/ML IJ SOLN
INTRAMUSCULAR | Status: DC | PRN
  Administered 2022-03-31: 15 mg via INTRAVENOUS

## 2022-03-31 SURGICAL SUPPLY — 19 items
CATH ROBINSON RED A/P 16FR (CATHETERS) ×1 IMPLANT
CURETTE PIPELLE ENDOMTRL SUCTN (MISCELLANEOUS) IMPLANT
DEVICE MYOSURE LITE (MISCELLANEOUS) IMPLANT
DEVICE MYOSURE REACH (MISCELLANEOUS) IMPLANT
DILATOR CANAL MILEX (MISCELLANEOUS) IMPLANT
GLOVE BIO SURGEON STRL SZ7 (GLOVE) ×1 IMPLANT
GLOVE BIOGEL PI IND STRL 7.0 (GLOVE) ×1 IMPLANT
GLOVE SURG SS PI 6.5 STRL IVOR (GLOVE) IMPLANT
GOWN STRL REUS W/TWL LRG LVL3 (GOWN DISPOSABLE) ×2 IMPLANT
KIT PROCEDURE FLUENT (KITS) ×1 IMPLANT
KIT TURNOVER CYSTO (KITS) ×1 IMPLANT
MYOSURE XL FIBROID (MISCELLANEOUS)
PACK VAGINAL MINOR WOMEN LF (CUSTOM PROCEDURE TRAY) ×1 IMPLANT
PAD OB MATERNITY 4.3X12.25 (PERSONAL CARE ITEMS) ×1 IMPLANT
PIPELLE ENDOMETRIAL SUCTION CU (MISCELLANEOUS) ×1
SEAL ROD LENS SCOPE MYOSURE (ABLATOR) ×1 IMPLANT
SLEEVE SCD COMPRESS KNEE MED (STOCKING) ×1 IMPLANT
SYSTEM TISS REMOVAL MYOSURE XL (MISCELLANEOUS) IMPLANT
TOWEL OR 17X24 6PK STRL BLUE (TOWEL DISPOSABLE) ×1 IMPLANT

## 2022-03-31 NOTE — H&P (Addendum)
PREOPERATIVE HISTORY AND PHYSICAL  Subjective:  Dorothy Haas is a 32 y.o. [phone] with LMP 02/17/22 presenting for hysteroscopy D&C for intermenstrual spotting/AUB   Has Nexplanon removed 06/2021 and has had intermenstrual spotting since Pelvic exam unremarkable. Pap NILM/HPV neg. 02/08/22 UPT negative 02/08/22 TSH 1.51 02/08/22 Urine GC/CT negative 02/10/22 Pelvic ultrasound: uterus 9.3 x 3.7 x 5.5cm w/ EL 9.37mm, CS scar visualized, normal ovaries   Is not actively TTC but would welcome pregnancy if it occurs. Is not using contraception.   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   Current Outpatient Medications on File Prior to Encounter  Medication Sig Dispense Refill   Ibuprofen (ADVIL) 200 MG CAPS Take by mouth as needed.     Allergies  Allergen Reactions   Egg-Derived Products Nausea And Vomiting   Promethazine     Hallucination   OB History     Gravida  3   Para  3   Term  3   Preterm      AB      Living  3      SAB      IAB      Ectopic      Multiple  0   Live Births  3          Social History   Socioeconomic History   Marital status: Married    Spouse name: Not on file   Number of children: Not on file   Years of education: Not on file   Highest education level: Not on file  Occupational History   Not on  file  Tobacco Use   Smoking status: Never   Smokeless tobacco: Never  Vaping Use   Vaping Use: Never used  Substance and Sexual Activity   Alcohol use: Never   Drug use: Never   Sexual activity: Yes    Birth control/protection: None  Other Topics Concern   Not on file  Social History Narrative   Not on file   Social Determinants of Health   Financial Resource Strain: Not on file  Food Insecurity: Not on file  Transportation Needs: Not on file  Physical Activity: Not on file  Stress: Not on file  Social Connections: Not on file  Intimate Partner Violence: Not At Risk (10/10/2017)   Humiliation, Afraid, Rape, and Kick questionnaire    Fear of Current or Ex-Partner: No    Emotionally Abused: No    Physically Abused: No    Sexually Abused: No   Objective:   Vitals:   03/25/22 0944  Weight: 59.4 kg  Height: 5\' 3"  (1.6 m)    General:  Alert, oriented and cooperative. Patient is in no acute distress.  Skin: Skin is warm and dry. No rash noted.   Cardiovascular: Normal heart rate noted  Respiratory: Normal respiratory effort, no  problems with respiration noted  Abdomen: Soft, non-tender, non-distended     Assessment and Plan:  Dorothy Haas is a 32 y.o. with intermenstrual spotting/AUB   Intermenstrual spotting/AUB Discussed sonohysterogram to evaluate for polyp vs hysteroscopy D&C for sampling, cavity evaluation & removal of polyp. She strongly prefers hysteroscopy D&C.  Reviewed risks of hsc D&C including but not limited to pain, infection, bleeding, injury to uterus & surrounding structures, fluid overload, electrolyte abnormalities as well as medical complications of surgery/anesthesia like VTE, MI, CVA. Reviewed recovery & post op restrictions Consents signed Pre op abx not indicated   2. Language barrier Patient interviewed and counseled with Arabic interpreter  Inez Catalina, MD

## 2022-03-31 NOTE — Op Note (Signed)
03/31/22 2:26 PM  Preoperative Diagnosis: AUB/intermenstrual spotting Postoperative Diagnosis: Same Procedure: Hysteroscopy D&C  Surgeon: Dr. Gale Journey Assistant: None EBL 5cc IVF: 75cc  UOP: not collected Deficit: 15cc  Specimens: endometrial curetting [facility])  Findings: Post clitoridectomy changes of external genitalia. Normal uterus, mobile. Normal hysteroscopic exam with no polyp or fibroid.   Description of the procedure: Preop antibiotics not indicated. Informed consent reviewed and signed. Pt given opportunity to ask questions.   Pt prepped and draped in the dorsal lithotomy fashion after LMA anesthesia found to be adequate. Timeout performed.   Open sided speculum placed into the patient's vagina. Single tooth tenaculum applied to the 12 o'clock position of the cervix. Cervix progressively dilated to a 19Fr. Hysteroscope was inserted into the cavity with the aforementioned findings noted. Gentle curettage performed.   Hemostatic at the end of the procedure. Procedure completed. All instruments removed. Counts correct x2.  Pt taken to recovery room in stable condition.  Gale Journey, MD [place], [facility] for [employer], [place]

## 2022-03-31 NOTE — Anesthesia Preprocedure Evaluation (Addendum)
Anesthesia Evaluation  Patient identified by MRN, date of birth, ID band Patient awake    Reviewed: Allergy & Precautions, NPO status , Patient's Chart, lab work & pertinent test results  History of Anesthesia Complications Negative for: history of anesthetic complications  Airway Mallampati: I  TM Distance: >3 FB Neck ROM: Full    Dental  (+) Dental Advisory Given   Pulmonary neg pulmonary ROS   breath sounds clear to auscultation       Cardiovascular negative cardio ROS  Rhythm:Regular Rate:Normal     Neuro/Psych negative neurological ROS     GI/Hepatic negative GI ROS, Neg liver ROS,neg GERD  ,,  Endo/Other  negative endocrine ROS    Renal/GU negative Renal ROS     Musculoskeletal   Abdominal   Peds  Hematology negative hematology ROS (+)   Anesthesia Other Findings   Reproductive/Obstetrics                             Anesthesia Physical Anesthesia Plan  ASA: 1  Anesthesia Plan: General   Post-op Pain Management: Tylenol PO (pre-op)*   Induction: Intravenous  PONV Risk Score and Plan: 3 and Ondansetron, Dexamethasone and Scopolamine patch - Pre-op  Airway Management Planned: LMA  Additional Equipment: None  Intra-op Plan:   Post-operative Plan:   Informed Consent: I have reviewed the patients History and Physical, chart, labs and discussed the procedure including the risks, benefits and alternatives for the proposed anesthesia with the patient or authorized representative who has indicated his/her understanding and acceptance.     Dental advisory given  Plan Discussed with: CRNA and Surgeon  Anesthesia Plan Comments:        Anesthesia Quick Evaluation

## 2022-03-31 NOTE — Anesthesia Postprocedure Evaluation (Signed)
Anesthesia Post Note  Patient: Dorothy Haas  Procedure(s) Performed: DILATATION AND CURETTAGE /HYSTEROSCOPY (Uterus)     Patient location during evaluation: Phase II Anesthesia Type: General Level of consciousness: awake and alert, patient cooperative and oriented Pain management: pain level controlled Vital Signs Assessment: post-procedure vital signs reviewed and stable Respiratory status: spontaneous breathing, nonlabored ventilation and respiratory function stable Cardiovascular status: blood pressure returned to baseline and stable Postop Assessment: no apparent nausea or vomiting, able to ambulate and adequate PO intake Anesthetic complications: no   No notable events documented.  Last Vitals:  Vitals:   03/31/22 1500 03/31/22 1534  BP: 116/78 126/86  Pulse: 64 66  Resp: 15 14  Temp:  36.7 C  SpO2: 100% 100%    Last Pain:  Vitals:   03/31/22 1534  TempSrc:   PainSc: 0-No pain                 Sri Clegg,E. Domanic Matusek

## 2022-03-31 NOTE — Discharge Instructions (Addendum)
  Post Anesthesia Home Care Instructions  Activity: Get plenty of rest for the remainder of the day. A responsible individual must stay with you for 24 hours following the procedure.  For the next 24 hours, DO NOT: -Drive a car -Paediatric nurse -Drink alcoholic beverages -Take any medication unless instructed by your physician -Make any legal decisions or sign important papers.  Meals: Start with liquid foods such as gelatin or soup. Progress to regular foods as tolerated. Avoid greasy, spicy, heavy foods. If nausea and/or vomiting occur, drink only clear liquids until the nausea and/or vomiting subsides. Call your physician if vomiting continues.  Special Instructions/Symptoms: Your throat may feel dry or sore from the anesthesia or the breathing tube placed in your throat during surgery. If this causes discomfort, gargle with warm salt water. The discomfort should disappear within 24 hours.  If you had a scopolamine patch placed behind your ear for the management of post- operative nausea and/or vomiting:  1. The medication in the patch is effective for 72 hours, after which it should be removed.  Wrap patch in a tissue and discard in the trash. Wash hands thoroughly with soap and water. 2. You may remove the patch earlier than 72 hours if you experience unpleasant side effects which may include dry mouth, dizziness or visual disturbances. 3. Avoid touching the patch. Wash your hands with soap and water after contact with the patch.    No ibuprofen, Advil, Aleve, Motrin, ketorolac, meloxicam, naproxen, or other NSAIDS until after 8:15 pm today if needed. No acetaminophen/Tylenol until after 7 pm today if needed.

## 2022-03-31 NOTE — Anesthesia Procedure Notes (Addendum)
Procedure Name: LMA Insertion Date/Time: 03/31/2022 1:57 PM  Performed by: Georgeanne Nim, CRNAPre-anesthesia Checklist: Patient identified, Emergency Drugs available, Suction available, Patient being monitored and Timeout performed Patient Re-evaluated:Patient Re-evaluated prior to induction Oxygen Delivery Method: Circle system utilized Preoxygenation: Pre-oxygenation with 100% oxygen Induction Type: IV induction Ventilation: Mask ventilation without difficulty LMA: LMA inserted LMA Size: 3.0 Number of attempts: 1 Placement Confirmation: positive ETCO2 and breath sounds checked- equal and bilateral Tube secured with: Tape Dental Injury: Teeth and Oropharynx as per pre-operative assessment

## 2022-03-31 NOTE — Transfer of Care (Signed)
Immediate Anesthesia Transfer of Care Note  Patient: Dorothy Haas  Procedure(s) Performed: DILATATION AND CURETTAGE /HYSTEROSCOPY (Uterus)  Patient Location: PACU  Anesthesia Type:General  Level of Consciousness: awake, alert , and oriented  Airway & Oxygen Therapy: Patient Spontanous Breathing and Patient connected to nasal cannula oxygen  Post-op Assessment: Report given to RN and Post -op Vital signs reviewed and stable  Post vital signs: Reviewed and stable  Last Vitals:  Vitals Value Taken Time  BP 114/78 03/31/22 1431  Temp    Pulse 66 03/31/22 1435  Resp 14 03/31/22 1435  SpO2 100 % 03/31/22 1435  Vitals shown include unvalidated device data.  Last Pain:  Vitals:   03/31/22 1256  TempSrc: Oral         Complications: No notable events documented.

## 2022-04-01 ENCOUNTER — Encounter (HOSPITAL_BASED_OUTPATIENT_CLINIC_OR_DEPARTMENT_OTHER): Payer: Self-pay | Admitting: Obstetrics and Gynecology

## 2022-04-01 LAB — SURGICAL PATHOLOGY

## 2022-07-08 ENCOUNTER — Encounter: Payer: Self-pay | Admitting: Obstetrics and Gynecology

## 2022-07-08 DIAGNOSIS — Z3480 Encounter for supervision of other normal pregnancy, unspecified trimester: Secondary | ICD-10-CM | POA: Insufficient documentation

## 2022-07-14 ENCOUNTER — Encounter: Payer: Self-pay | Admitting: Obstetrics and Gynecology

## 2022-07-14 ENCOUNTER — Other Ambulatory Visit: Payer: BLUE CROSS/BLUE SHIELD

## 2022-07-14 ENCOUNTER — Ambulatory Visit (INDEPENDENT_AMBULATORY_CARE_PROVIDER_SITE_OTHER): Payer: BLUE CROSS/BLUE SHIELD | Admitting: Obstetrics and Gynecology

## 2022-07-14 VITALS — BP 122/87 | HR 112 | Wt 117.0 lb

## 2022-07-14 DIAGNOSIS — O99119 Other diseases of the blood and blood-forming organs and certain disorders involving the immune mechanism complicating pregnancy, unspecified trimester: Secondary | ICD-10-CM | POA: Diagnosis not present

## 2022-07-14 DIAGNOSIS — Z3A08 8 weeks gestation of pregnancy: Secondary | ICD-10-CM | POA: Diagnosis not present

## 2022-07-14 DIAGNOSIS — Z3480 Encounter for supervision of other normal pregnancy, unspecified trimester: Secondary | ICD-10-CM

## 2022-07-14 DIAGNOSIS — D696 Thrombocytopenia, unspecified: Secondary | ICD-10-CM

## 2022-07-14 DIAGNOSIS — Z758 Other problems related to medical facilities and other health care: Secondary | ICD-10-CM

## 2022-07-14 DIAGNOSIS — O3429 Maternal care due to uterine scar from other previous surgery: Secondary | ICD-10-CM

## 2022-07-14 DIAGNOSIS — Z603 Acculturation difficulty: Secondary | ICD-10-CM | POA: Diagnosis not present

## 2022-07-14 NOTE — Progress Notes (Signed)
History:   Dorothy Haas is a 32 y.o. [phone] at [redacted]w[redacted]d by early ultrasound being seen today for her first obstetrical visit.   Patient does intend to breast feed.   Pregnancy history fully reviewed. Obstetrical history is significant for 3 prior c-sections with the last one showing a 6 cm uterine window. She also has a history of gestational thrombocytopenia in her prior pregnancy.   Patient reports nausea.      HISTORY: OB History  Gravida Para Term Preterm AB Living  4 3 3  0 0 3  SAB IAB Ectopic Multiple Live Births  0 0 0 0 3    # Outcome Date GA Lbr Len/2nd Weight Sex Delivery Anes PTL Lv  4 Current           3 Term 05/01/18 [redacted]w[redacted]d  7 lb 9.5 oz (3.445 kg) F CS-LTranv Spinal  LIV     Name: Dorothy Haas, Dorothy Haas     Apgar1: 8  Apgar5: 8  2 Term 01/20/13    M CS-Unspec Spinal  LIV  1 Term 05/10/09    F CS-Unspec Spinal  LIV     Complications: Cephalopelvic Disproportion     Last pap smear was done 2024 Lab Results  Component Value Date   DIAGPAP  03/10/2022    - Negative for intraepithelial lesion or malignancy (NILM)   DIAGPAP  10/10/2017    NEGATIVE FOR INTRAEPITHELIAL LESIONS OR MALIGNANCY.   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   History of gastroesophageal reflux (GERD) 02/02/2018   during pregnancy   History of hyperthyroidism 2019   during pregnancy--- resolved   History of thrombocytopenia 05/01/2018   gestational   Past Surgical History:  Procedure Laterality Date   CESAREAN SECTION     2011 and 2015   CESAREAN SECTION N/A 05/01/2018   Procedure: CESAREAN SECTION;  Surgeon: Adam Phenix, MD;  Location: [facility];  Service: Obstetrics;  Laterality: N/A;   HYSTEROSCOPY WITH D & C N/A 03/31/2022   Procedure: DILATATION AND CURETTAGE /HYSTEROSCOPY;  Surgeon: Lennart Pall, MD;  Location: [facility];  Service: Gynecology;   Laterality: N/A;   History reviewed. No pertinent family history. Social History   Tobacco Use   Smoking status: Never   Smokeless tobacco: Never  Vaping Use   Vaping Use: Never used  Substance Use Topics   Alcohol use: Never   Drug use: Never   Allergies  Allergen Reactions   Egg-Derived Products Nausea And Vomiting   Promethazine     Hallucination   Current Outpatient Medications on File Prior to Visit  Medication Sig Dispense Refill   Doxylamine-Pyridoxine 10-10 MG TBEC Take 2 tablets by mouth at bedtime.     Ibuprofen (ADVIL) 200 MG CAPS Take by mouth as needed.     metoCLOPramide (REGLAN) 10 MG tablet Take by mouth.     ondansetron (ZOFRAN-ODT) 8 MG disintegrating tablet Take by mouth.     No current facility-administered medications on file prior to visit.    Review of Systems Pertinent items noted in HPI and remainder of comprehensive ROS otherwise negative.  Physical Exam:   Vitals:   07/14/22 1351  BP: 122/87  Pulse: (!) 112  Weight: 117 lb (53.1 kg)   Fetal Heart Rate (bpm): 170 Patient informed that the ultrasound is considered a limited obstetric ultrasound and is not intended  to be a complete ultrasound exam.  Patient also informed that the ultrasound is not being completed with the intent of assessing for fetal or placental anomalies or any pelvic abnormalities.  Explained that the purpose of today's ultrasound is to assess for fetal heart rate.  Patient acknowledges the purpose of the exam and the limitations of the study.  General: well-developed, well-nourished female in no acute distress  Breasts:  Not examined  Skin: normal coloration and turgor, no rashes  Neurologic: oriented, normal, negative, normal mood  Extremities: normal strength, tone, and muscle mass, ROM of all joints is normal  HEENT PERRLA, extraocular movement intact and sclera clear, anicteric  Neck supple and no masses  Cardiovascular: regular rate and rhythm  Respiratory:  no  respiratory distress, normal breath sounds  Abdomen: soft, non-tender; bowel sounds normal; no masses,  no organomegaly  Pelvic: Not examined    Assessment:    Pregnancy: [id number] Patient Active Problem List   Diagnosis Date Noted   Supervision of other normal pregnancy, antepartum 07/08/2022   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 Supervision of other normal pregnancy, antepartum Initial labs drawn. Continue prenatal vitamins. Problem list reviewed and updated. Genetic Screening discussed, NIPS: ordered. Ultrasound discussed; fetal anatomic survey: ordered. Anticipatory guidance about prenatal visits given including labs, ultrasounds, and testing. Discussed usage of Babyscripts and virtual visits  2. Benign gestational thrombocytopenia, antepartum (HCC) Check CBC with PNL  3. Uterine scar from previous surgery affecting pregnancy Recommend RLTCS. Plan may be for c-section at 37w.  She also desires tubal.   Interpreter used throughout our visit.     The nature of [place] [facility]Faculty Practice with multiple MDs and Advanced Practice Providers was explained to patient; also emphasized that residents, students are part of our team. Routine obstetric precautions reviewed. Encouraged to seek out care at office or emergency room [facility] preferred) for urgent and/or emergent concerns. Return in about 4 weeks (around 08/11/2022) for LROB VISIT, MD or APP, Schedule [place] with MFM.    Milas Hock, MD, FACOG Obstetrician & Gynecologist, [facility] for [facility], [facility]

## 2022-07-20 ENCOUNTER — Encounter: Payer: Self-pay | Admitting: Obstetrics and Gynecology

## 2022-07-20 DIAGNOSIS — R8271 Bacteriuria: Secondary | ICD-10-CM | POA: Insufficient documentation

## 2022-07-20 LAB — PREGNANCY, INITIAL SCREEN
Antibody Screen: NEGATIVE
Basophils Absolute: 0 10*3/uL (ref 0.0–0.2)
Basos: 0 %
Chlamydia trachomatis, NAA: NEGATIVE
EOS (ABSOLUTE): 0.1 10*3/uL (ref 0.0–0.4)
Eos: 1 %
HCV Ab: NONREACTIVE
HIV Screen 4th Generation wRfx: NONREACTIVE
Hematocrit: 41.4 % (ref 34.0–46.6)
Hemoglobin: 14.1 g/dL (ref 11.1–15.9)
Hepatitis B Surface Ag: NEGATIVE
Immature Grans (Abs): 0 10*3/uL (ref 0.0–0.1)
Immature Granulocytes: 0 %
Lymphocytes Absolute: 2.6 10*3/uL (ref 0.7–3.1)
Lymphs: 24 %
MCH: 28.3 pg (ref 26.6–33.0)
MCHC: 34.1 g/dL (ref 31.5–35.7)
MCV: 83 fL (ref 79–97)
Monocytes Absolute: 1.1 10*3/uL — ABNORMAL HIGH (ref 0.1–0.9)
Monocytes: 10 %
Neisseria Gonorrhoeae by PCR: NEGATIVE
Neutrophils Absolute: 6.7 10*3/uL (ref 1.4–7.0)
Neutrophils: 65 %
Platelets: 237 10*3/uL (ref 150–450)
RBC: 4.98 x10E6/uL (ref 3.77–5.28)
RDW: 13.1 % (ref 11.7–15.4)
RPR Ser Ql: NONREACTIVE
Rh Factor: POSITIVE
Rubella Antibodies, IGG: 9.68 index (ref >0.99)
WBC: 10.5 10*3/uL (ref 3.4–10.8)

## 2022-07-20 LAB — URINE CULTURE, OB REFLEX

## 2022-07-20 LAB — HCV INTERPRETATION

## 2022-08-09 ENCOUNTER — Ambulatory Visit (INDEPENDENT_AMBULATORY_CARE_PROVIDER_SITE_OTHER): Payer: BLUE CROSS/BLUE SHIELD | Admitting: Obstetrics & Gynecology

## 2022-08-09 ENCOUNTER — Other Ambulatory Visit: Payer: Self-pay | Admitting: Certified Nurse Midwife

## 2022-08-09 ENCOUNTER — Other Ambulatory Visit: Payer: Self-pay | Admitting: Obstetrics & Gynecology

## 2022-08-09 ENCOUNTER — Inpatient Hospital Stay (HOSPITAL_COMMUNITY)
Admission: AD | Admit: 2022-08-09 | Discharge: 2022-08-09 | Disposition: A | Discharge status: Home / Self Care | Payer: BLUE CROSS/BLUE SHIELD | Attending: Obstetrics and Gynecology | Admitting: Obstetrics and Gynecology

## 2022-08-09 ENCOUNTER — Encounter (HOSPITAL_COMMUNITY): Payer: Self-pay | Admitting: Obstetrics and Gynecology

## 2022-08-09 ENCOUNTER — Encounter: Payer: Self-pay | Admitting: Obstetrics & Gynecology

## 2022-08-09 VITALS — BP 105/72 | HR 94 | Wt 112.0 lb

## 2022-08-09 DIAGNOSIS — O219 Vomiting of pregnancy, unspecified: Secondary | ICD-10-CM | POA: Insufficient documentation

## 2022-08-09 DIAGNOSIS — O210 Mild hyperemesis gravidarum: Secondary | ICD-10-CM

## 2022-08-09 DIAGNOSIS — Z3A12 12 weeks gestation of pregnancy: Secondary | ICD-10-CM | POA: Insufficient documentation

## 2022-08-09 DIAGNOSIS — O99611 Diseases of the digestive system complicating pregnancy, first trimester: Secondary | ICD-10-CM | POA: Insufficient documentation

## 2022-08-09 DIAGNOSIS — Z3480 Encounter for supervision of other normal pregnancy, unspecified trimester: Secondary | ICD-10-CM

## 2022-08-09 DIAGNOSIS — K117 Disturbances of salivary secretion: Secondary | ICD-10-CM | POA: Diagnosis not present

## 2022-08-09 HISTORY — DX: Other specified health status: Z78.9

## 2022-08-09 LAB — POCT URINALYSIS DIPSTICK
Bilirubin, UA: NEGATIVE
Blood, UA: NEGATIVE
Glucose, UA: NEGATIVE
Leukocytes, UA: NEGATIVE
Nitrite, UA: NEGATIVE
Protein, UA: POSITIVE — AB
Spec Grav, UA: >=1.03 — AB (ref 1.010–1.025)
Urobilinogen, UA: NEGATIVE E.U./dL — AB
pH, UA: 7 (ref 5.0–8.0)

## 2022-08-09 MED ORDER — DOXYLAMINE-PYRIDOXINE 10-10 MG PO TBEC: 2.0000 | DELAYED_RELEASE_TABLET | Freq: Every day | ORAL | 6 refills | Status: DC

## 2022-08-09 MED ORDER — FAMOTIDINE IN NACL 20-0.9 MG/50ML-% IV SOLN
20.0000 mg | Freq: Once | INTRAVENOUS | Status: AC
  Administered 2022-08-09: 20 mg via INTRAVENOUS
  Filled 2022-08-09: qty 50

## 2022-08-09 MED ORDER — ONDANSETRON HCL 4 MG/2ML IJ SOLN
4.0000 mg | Freq: Once | INTRAMUSCULAR | Status: AC
  Administered 2022-08-09: 4 mg via INTRAVENOUS
  Filled 2022-08-09: qty 2

## 2022-08-09 MED ORDER — LACTATED RINGERS IV BOLUS
1000.0000 mL | Freq: Once | INTRAVENOUS | Status: AC
  Administered 2022-08-09: 1000 mL via INTRAVENOUS

## 2022-08-09 MED ORDER — GLYCOPYRROLATE 1 MG PO TABS: 1.0000 mg | ORAL_TABLET | Freq: Three times a day (TID) | ORAL | 0 refills | Status: DC

## 2022-08-09 MED ORDER — PRENATAL VITAMIN 27-0.8 MG PO TABS: ORAL_TABLET | ORAL | 12 refills | Status: DC

## 2022-08-09 MED ORDER — GLYCOPYRROLATE 0.2 MG/ML IJ SOLN
0.2000 mg | Freq: Once | INTRAMUSCULAR | Status: AC
  Administered 2022-08-09: 0.2 mg via INTRAVENOUS
  Filled 2022-08-09: qty 1

## 2022-08-09 NOTE — Progress Notes (Signed)
   PRENATAL VISIT NOTE  Subjective:  Dorothy Haas is a 32 y.o. [phone] at [redacted]w[redacted]d being seen today for ongoing prenatal care.  She is currently monitored for the following issues for this low-risk pregnancy and has Female circumcision; Hyperemesis gravidarum; Uterine scar from previous surgery affecting pregnancy; History of Gestational thrombocytopenia (HCC); Supervision of other normal pregnancy, antepartum; Language barrier; and GBS bacteriuria on their problem list.  Patient reports nausea, vomiting, and spitting.  Vomits 3x a day and dizzy when standing.  .  Contractions: Not present. Vag. Bleeding: None.  Movement: Absent. Denies leaking of fluid.   The following portions of the patient's history were reviewed and updated as appropriate: allergies, current medications, past family history, past medical history, past social history, past surgical history and problem list.   Objective:   Vitals:   08/09/22 1359  BP: 105/72  Pulse: 94  Weight: 112 lb (50.8 kg)    Fetal Status: Fetal Heart Rate (bpm): 147   Movement: Absent     General:  Alert, oriented and cooperative. Patient is in no acute distress.  Skin: Skin is warm and dry. No rash noted.   Cardiovascular: Normal heart rate noted  Respiratory: Normal respiratory effort, no problems with respiration noted  Abdomen: Soft, gravid, appropriate for gestational age.  Pain/Pressure: Absent     Pelvic: Cervical exam deferred        Extremities: Normal range of motion.  Edema: None  Mental Status: Normal mood and affect. Normal behavior. Normal judgment and thought content.   Assessment and Plan:  Pregnancy: [id number] at [redacted]w[redacted]d 1. Supervision of other normal pregnancy, antepartum - POCT Urinalysis Dipstick--large ketones -will set up with outpatient infusions a week Pt cna't eat or drink naything right now--will send to MAU for fluids and antimetic.  MAU called and secure chatted   Interpreter present for visit.    Preterm labor symptoms and general obstetric precautions including but not limited to vaginal bleeding, contractions, leaking of fluid and fetal movement were reviewed in detail with the patient. Please refer to After Visit Summary for other counseling recommendations.   No follow-ups on file.  Future Appointments  Date Time Provider Department Center  09/07/2022 10:50 AM Rasch, Harolyn Rutherford, NP [facility]  09/23/2022  9:15 AM [facility] NURSE [facility]  09/23/2022  9:30 AM [facility] US4 [facility]    Elsie Lincoln, MD

## 2022-08-09 NOTE — MAU Note (Signed)
Vitals:   08/09/22 1612 08/09/22 2209  BP: 115/74 114/63  Pulse: 94 75  Resp: 14 17  Temp: 97.9 F (36.6 C)   SpO2: 99% 100%    FHR: 145

## 2022-08-09 NOTE — Progress Notes (Signed)
Orders for weekly infusions x 12 weeks with monthly decadron dosing until 24wks for hyperemesis gravidarum. Edd Arbour, CNM, MSN, IBCLC Certified Nurse Midwife, [facility]

## 2022-08-09 NOTE — MAU Provider Note (Signed)
Chief Complaint:  Nausea, Emesis, Heartburn, Dizziness, and Tachycardia  HPI   Event Date/Time   First Provider Initiated Contact with Patient 08/09/22 2004     Dorothy Haas is a 32 y.o. [phone] at [redacted]w[redacted]d who presents to maternity admissions reporting nausea, vomiting and heartburn since she got pregnant, now feeling dizzy and weak with a fast heart rate when she tries to walk around. Was sent from the office for hydration and IV meds. No other physical complaints.   Pregnancy Course: Receives care at [facility], prenatal records reviewed.   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   History of gastroesophageal reflux (GERD) 02/02/2018   during pregnancy   History of hyperthyroidism 2019   during pregnancy--- resolved   History of thrombocytopenia 05/01/2018   gestational   Medical history non-contributory    OB History  Gravida Para Term Preterm AB Living  4 3 3     3   SAB IAB Ectopic Multiple Live Births        0 3    # Outcome Date GA Lbr Len/2nd Weight Sex Type Anes PTL Lv  4 Current           3 Term 05/01/18 [redacted]w[redacted]d  7 lb 9.5 oz (3.445 kg) F CS-LTranv Spinal  LIV  2 Term 01/20/13    M CS-Unspec Spinal  LIV  1 Term 05/10/09    F CS-Unspec Spinal  LIV     Complications: Cephalopelvic Disproportion   Past Surgical History:  Procedure Laterality Date   CESAREAN SECTION     2011 and 2015   CESAREAN SECTION N/A 05/01/2018   Procedure: CESAREAN SECTION;  Surgeon: Adam Phenix, MD;  Location: [facility] LD ORS;  Service: Obstetrics;  Laterality: N/A;   HYSTEROSCOPY WITH D & C N/A 03/31/2022   Procedure: DILATATION AND CURETTAGE /HYSTEROSCOPY;  Surgeon: Lennart Pall, MD;  Location: [facility];  Service: Gynecology;  Laterality: N/A;   History reviewed. No pertinent family history. Social History   Tobacco Use   Smoking status: Never   Smokeless tobacco: Never  Vaping Use    Vaping status: Never Used  Substance Use Topics   Alcohol use: Never   Drug use: Never   Allergies  Allergen Reactions   Egg-Derived Products Nausea And Vomiting   Promethazine     Hallucination   Medications Prior to Admission  Medication Sig Dispense Refill Last Dose   Doxylamine-Pyridoxine 10-10 MG TBEC Take 2 tablets by mouth at bedtime. 60 tablet 6 Past Week   ondansetron (ZOFRAN-ODT) 8 MG disintegrating tablet Take by mouth.   08/09/2022 at 1200   metoCLOPramide (REGLAN) 10 MG tablet Take by mouth.      Prenatal Vit-Fe Fumarate-FA (PRENATAL VITAMIN) 27-0.8 MG TABS 1 tablet daily 30 tablet 12    I have reviewed patient's Past Medical Hx, Surgical Hx, Family Hx, Social Hx, medications and allergies.   ROS  Pertinent items noted in HPI and remainder of comprehensive ROS otherwise negative.   PHYSICAL EXAM  Patient Vitals for the past 24 hrs:  BP Temp Temp src Pulse Resp SpO2 Height Weight  08/09/22 1612 115/74 97.9 F (36.6 C) Oral 94 14 99 % 5\' 2"  (1.575 m) 114 lb (51.7 kg)   Constitutional: Well-developed, well-nourished female in no acute distress.  Cardiovascular: normal rate & rhythm, warm and well-perfused Respiratory: normal effort, no problems with respiration noted GI: Abd soft, non-tender,  non-distended MS: Extremities nontender, no edema, normal ROM Neurologic: Alert and oriented x 4.  GU: no CVA tenderness Pelvic: exam deferred  FHR: 145  Labs: Results for orders placed or performed in visit on 08/09/22 (from the past 24 hour(s))  POCT Urinalysis Dipstick     Status: Abnormal   Collection Time: 08/09/22  2:38 PM  Result Value Ref Range   Color, UA yellow    Clarity, UA cloudy    Glucose, UA Negative Negative   Bilirubin, UA neg    Ketones, UA large    Spec Grav, UA >=1.030 (A) 1.010 - 1.025   Blood, UA neg    pH, UA 7.0 5.0 - 8.0   Protein, UA Positive (A) Negative   Urobilinogen, UA negative (A) 0.2 or 1.0 E.U./dL   Nitrite, UA neg    Leukocytes,  UA Negative Negative   Appearance cloudy    Odor none    Imaging:  No results found.  MDM & MAU COURSE  MDM: Moderate  MAU Course: Orders Placed This Encounter  Procedures   Insert peripheral IV   Discharge patient   Meds ordered this encounter  Medications   lactated ringers bolus 1,000 mL   ondansetron (ZOFRAN) injection 4 mg   famotidine (PEPCID) IVPB 20 mg premix   glycopyrrolate (ROBINUL) injection 0.2 mg   lactated ringers bolus 1,000 mL   glycopyrrolate (ROBINUL) 1 MG tablet    Sig: Take 1 tablet (1 mg total) by mouth 3 (three) times daily.    Dispense:  90 tablet    Refill:  0   Pt given LR bolus x2, IV zofran, pepcid and robinul with good relief of symptoms and successful PO challenge. Orders placed for weekly IV infusions with fluids/meds and message sent to [facility] office for scheduling assistance.  ASSESSMENT   1. Nausea/vomiting in pregnancy   2. Ptyalism   3. [redacted] weeks gestation of pregnancy    PLAN  Discharge home in stable condition with return precautions.     Follow-up Information     [facility] Follow up.   Specialty: Obstetrics and Gynecology Why: as scheduled for ongoing prenatal care Contact information: [address], [address] [place] [address] [phone]                Allergies as of 08/09/2022       Reactions   Egg-derived Products Nausea And Vomiting   Promethazine    Hallucination        Medication List     TAKE these medications    Doxylamine-Pyridoxine 10-10 MG Tbec Take 2 tablets by mouth at bedtime.   glycopyrrolate 1 MG tablet Commonly known as: Robinul Take 1 tablet (1 mg total) by mouth 3 (three) times daily.   metoCLOPramide 10 MG tablet Commonly known as: REGLAN Take by mouth.   ondansetron 8 MG disintegrating tablet Commonly known as: ZOFRAN-ODT Take by mouth.   Prenatal Vitamin 27-0.8 MG Tabs 1 tablet daily        Edd Arbour, CNM, MSN, [employer] Certified Nurse Midwife, [facility]

## 2022-08-09 NOTE — MAU Note (Addendum)
Dorothy Haas is a 32 y.o. at [redacted]w[redacted]d here in MAU reporting: feels tired and dizzy and her heart is beating very fast.pt was sent from clinic in [place]. Pain in epigastric area, burning, comes and goes. Has been nauseated and vomiting.  Onset of complaint: 2-3 days Pain score: 8 Vitals:   08/09/22 1612  BP: 115/74  Pulse: 94  Resp: 14  Temp: 97.9 F (36.6 C)  SpO2: 99%     Unable to get at this time due to clothing Lab orders placed from triage:  urine  done at clinic

## 2022-09-07 ENCOUNTER — Ambulatory Visit (INDEPENDENT_AMBULATORY_CARE_PROVIDER_SITE_OTHER): Payer: BLUE CROSS/BLUE SHIELD | Admitting: Obstetrics and Gynecology

## 2022-09-07 VITALS — BP 112/73 | HR 90 | Wt 115.0 lb

## 2022-09-07 DIAGNOSIS — Z3482 Encounter for supervision of other normal pregnancy, second trimester: Secondary | ICD-10-CM

## 2022-09-07 DIAGNOSIS — Z3480 Encounter for supervision of other normal pregnancy, unspecified trimester: Secondary | ICD-10-CM

## 2022-09-07 NOTE — Progress Notes (Signed)
   PRENATAL VISIT NOTE  Subjective:  Dorothy Haas is a 32 y.o. [phone] at [redacted]w[redacted]d being seen today for ongoing prenatal care.  She is currently monitored for the following issues for this low-risk pregnancy and has Female circumcision; Hyperemesis gravidarum; Uterine scar from previous surgery affecting pregnancy; History of Gestational thrombocytopenia (HCC); Supervision of other normal pregnancy, antepartum; Language barrier; and GBS bacteriuria on their problem list.  Patient reports no complaints.  Contractions: Not present. Vag. Bleeding: None.  Movement: Absent. Denies leaking of fluid.   The following portions of the patient's history were reviewed and updated as appropriate: allergies, current medications, past family history, past medical history, past social history, past surgical history and problem list.   Objective:   Vitals:   09/07/22 1058  BP: 112/73  Pulse: 90  Weight: 115 lb (52.2 kg)    Fetal Status: Fetal Heart Rate (bpm): 157   Movement: Absent     General:  Alert, oriented and cooperative. Patient is in no acute distress.  Skin: Skin is warm and dry. No rash noted.   Cardiovascular: Normal heart rate noted  Respiratory: Normal respiratory effort, no problems with respiration noted  Abdomen: Soft, gravid, appropriate for gestational age.  Pain/Pressure: Absent     Pelvic: Cervical exam deferred        Extremities: Normal range of motion.  Edema: None  Mental Status: Normal mood and affect. Normal behavior. Normal judgment and thought content.   Assessment and Plan:  Pregnancy: G4P3003 at [redacted]w[redacted]d 1. Supervision of other normal pregnancy, antepartum  - AFP - PANORAMA PRENATAL TEST - HORIZON Basic Panel - Still having N/V. Patient inquiring about IVF's through the infusion center. Per Dr. Bertram Denver note the patient is going to have appointments set up. Will discuss with Dr. Penne Lash.   Preterm labor symptoms and general obstetric precautions including  but not limited to vaginal bleeding, contractions, leaking of fluid and fetal movement were reviewed in detail with the patient. Please refer to After Visit Summary for other counseling recommendations.   No follow-ups on file.  Future Appointments  Date Time Provider Department Center  09/23/2022  9:15 AM [facility] NURSE [facility]  09/23/2022  9:30 AM [facility] US4 [facility]  10/05/2022  9:50 AM Sue Lush, FNP [facility]    Venia Carbon, NP

## 2022-09-09 ENCOUNTER — Other Ambulatory Visit: Payer: Self-pay | Admitting: Pharmacy Technician

## 2022-09-09 LAB — AFP, SERUM, OPEN SPINA BIFIDA
AFP MoM: 1.64
AFP Value: 71.3 ng/mL
Gest. Age on Collection Date: 16.7 wk
Maternal Age At EDD: 32.1 a
OSBR Risk 1 IN: 1895
Test Results:: NEGATIVE
Weight: 115 [lb_av]

## 2022-09-09 LAB — PANORAMA PRENATAL TEST FULL PANEL:PANORAMA TEST PLUS 5 ADDITIONAL MICRODELETIONS: FETAL FRACTION: 9.4

## 2022-09-10 ENCOUNTER — Ambulatory Visit (INDEPENDENT_AMBULATORY_CARE_PROVIDER_SITE_OTHER): Payer: BLUE CROSS/BLUE SHIELD

## 2022-09-10 VITALS — BP 91/60 | HR 84 | Temp 98.1°F | Resp 20 | Ht 62.0 in | Wt 117.6 lb

## 2022-09-10 DIAGNOSIS — O210 Mild hyperemesis gravidarum: Secondary | ICD-10-CM | POA: Diagnosis not present

## 2022-09-10 DIAGNOSIS — Z3A17 17 weeks gestation of pregnancy: Secondary | ICD-10-CM

## 2022-09-10 MED ORDER — PROMETHAZINE HCL 25 MG/ML IJ SOLN
25.0000 mg | Freq: Once | INTRAVENOUS | Status: AC
  Administered 2022-09-10: 25 mg via INTRAVENOUS
  Filled 2022-09-10: qty 1

## 2022-09-10 NOTE — Progress Notes (Signed)
Diagnosis: Hyperemesis gravidarum  Provider:  Chilton Greathouse MD  Procedure: IV Infusion  IV Type: Peripheral, IV Location: L Antecubital  Phenergan 25 mg in 1000 mL LR  Infusion Start Time: 1123  Infusion Stop Time: 1230  Post Infusion IV Care: Peripheral IV Discontinued  Discharge: Condition: Good, Destination: Home . AVS Provided  Performed by:  Wyvonne Lenz, RN

## 2022-09-14 ENCOUNTER — Ambulatory Visit (INDEPENDENT_AMBULATORY_CARE_PROVIDER_SITE_OTHER): Payer: Medicaid Other | Admitting: *Deleted

## 2022-09-14 VITALS — BP 92/59 | HR 75 | Temp 97.3°F | Resp 16 | Ht 62.0 in | Wt 116.6 lb

## 2022-09-14 DIAGNOSIS — O210 Mild hyperemesis gravidarum: Secondary | ICD-10-CM | POA: Diagnosis not present

## 2022-09-14 DIAGNOSIS — Z3A17 17 weeks gestation of pregnancy: Secondary | ICD-10-CM | POA: Diagnosis not present

## 2022-09-14 MED ORDER — PROMETHAZINE HCL 25 MG/ML IJ SOLN
25.0000 mg | Freq: Once | INTRAVENOUS | Status: AC
  Administered 2022-09-14: 25 mg via INTRAVENOUS
  Filled 2022-09-14: qty 1

## 2022-09-14 NOTE — Progress Notes (Signed)
Diagnosis: Hyperemesis gravidarum  Provider:  Chilton Greathouse MD  Procedure: IV Infusion  IV Type: Peripheral, IV Location: R Upper Arm  Lactated Ringers with 25 mg Phenergan, Dose: 1000 ml  Infusion Start Time: 1148 am  Infusion Stop Time: 1255 pm  Post Infusion IV Care: Observation period completed and Peripheral IV Discontinued 1255 Discharge: Condition: Good, Destination: Home . AVS Declined  Performed by:  Forrest Moron, RN

## 2022-09-17 ENCOUNTER — Ambulatory Visit (INDEPENDENT_AMBULATORY_CARE_PROVIDER_SITE_OTHER): Payer: Medicaid Other | Admitting: *Deleted

## 2022-09-17 VITALS — BP 117/80 | HR 84 | Temp 97.7°F | Resp 16 | Ht 62.0 in | Wt 118.6 lb

## 2022-09-17 DIAGNOSIS — Z3A18 18 weeks gestation of pregnancy: Secondary | ICD-10-CM | POA: Diagnosis not present

## 2022-09-17 DIAGNOSIS — O210 Mild hyperemesis gravidarum: Secondary | ICD-10-CM

## 2022-09-17 MED ORDER — PROMETHAZINE HCL 25 MG/ML IJ SOLN
25.0000 mg | Freq: Once | INTRAVENOUS | Status: AC
  Administered 2022-09-17: 25 mg via INTRAVENOUS
  Filled 2022-09-17: qty 1

## 2022-09-17 NOTE — Progress Notes (Signed)
Diagnosis: Hyperemesis  gravidarum  Provider:  Chilton Greathouse MD  Procedure: IV Infusion  IV Type: Peripheral, IV Location: L Antecubital  Lactated Ringers with Phenergan 25 mg, Dose: 1000 ml  Infusion Start Time: 1136 am  Infusion Stop Time: 1259 pm  Post Infusion IV Care: Observation period completed and Peripheral IV Discontinued  Discharge: Condition: Good, Destination: Home . AVS Declined  Performed by:  Forrest Moron, RN

## 2022-09-23 ENCOUNTER — Other Ambulatory Visit: Payer: Self-pay | Admitting: *Deleted

## 2022-09-23 ENCOUNTER — Ambulatory Visit: Payer: Medicaid Other | Admitting: *Deleted

## 2022-09-23 ENCOUNTER — Ambulatory Visit: Payer: Medicaid Other | Attending: Obstetrics and Gynecology

## 2022-09-23 VITALS — BP 103/67 | HR 82

## 2022-09-23 DIAGNOSIS — Z363 Encounter for antenatal screening for malformations: Secondary | ICD-10-CM | POA: Insufficient documentation

## 2022-09-23 DIAGNOSIS — Z3480 Encounter for supervision of other normal pregnancy, unspecified trimester: Secondary | ICD-10-CM

## 2022-09-23 DIAGNOSIS — O321XX0 Maternal care for breech presentation, not applicable or unspecified: Secondary | ICD-10-CM | POA: Diagnosis not present

## 2022-09-23 DIAGNOSIS — O34219 Maternal care for unspecified type scar from previous cesarean delivery: Secondary | ICD-10-CM | POA: Insufficient documentation

## 2022-09-23 DIAGNOSIS — O09292 Supervision of pregnancy with other poor reproductive or obstetric history, second trimester: Secondary | ICD-10-CM | POA: Insufficient documentation

## 2022-09-23 DIAGNOSIS — Z3A19 19 weeks gestation of pregnancy: Secondary | ICD-10-CM | POA: Diagnosis not present

## 2022-09-23 DIAGNOSIS — Z362 Encounter for other antenatal screening follow-up: Secondary | ICD-10-CM

## 2022-09-24 LAB — HORIZON CUSTOM: REPORT SUMMARY: POSITIVE — AB

## 2022-09-25 ENCOUNTER — Encounter: Payer: Self-pay | Admitting: Obstetrics and Gynecology

## 2022-09-25 DIAGNOSIS — D563 Thalassemia minor: Secondary | ICD-10-CM

## 2022-09-25 HISTORY — DX: Thalassemia minor: D56.3

## 2022-09-28 ENCOUNTER — Encounter: Payer: Self-pay | Admitting: Obstetrics & Gynecology

## 2022-10-05 ENCOUNTER — Ambulatory Visit (INDEPENDENT_AMBULATORY_CARE_PROVIDER_SITE_OTHER): Payer: Medicaid Other | Admitting: Obstetrics and Gynecology

## 2022-10-05 VITALS — BP 104/69 | HR 82 | Wt 120.0 lb

## 2022-10-05 DIAGNOSIS — O26892 Other specified pregnancy related conditions, second trimester: Secondary | ICD-10-CM

## 2022-10-05 DIAGNOSIS — O210 Mild hyperemesis gravidarum: Secondary | ICD-10-CM

## 2022-10-05 DIAGNOSIS — R12 Heartburn: Secondary | ICD-10-CM

## 2022-10-05 DIAGNOSIS — Z3480 Encounter for supervision of other normal pregnancy, unspecified trimester: Secondary | ICD-10-CM

## 2022-10-05 DIAGNOSIS — Z3A20 20 weeks gestation of pregnancy: Secondary | ICD-10-CM

## 2022-10-05 MED ORDER — FAMOTIDINE 20 MG PO TABS: 20.0000 mg | ORAL_TABLET | Freq: Every day | ORAL | 0 refills | Status: DC

## 2022-10-05 NOTE — Progress Notes (Signed)
Pt is having pain at previous c-section incision

## 2022-10-05 NOTE — Progress Notes (Signed)
   PRENATAL VISIT NOTE  Subjective:  Dorothy Haas is a 32 y.o. [phone] at [redacted]w[redacted]d being seen today for ongoing prenatal care.  She is currently monitored for the following issues for this high-risk pregnancy and has History of cesarean delivery affecting pregnancy (G3 with uterine window); Female circumcision; Hyperemesis gravidarum; Uterine scar from previous surgery affecting pregnancy; History of Gestational thrombocytopenia (HCC); Supervision of other normal pregnancy, antepartum; Language barrier; GBS bacteriuria; and Alpha thalassemia silent carrier on their problem list.  Patient reports  pain on left side near incision, reports its coming and going feels "achy". Still having nausea, stopped the infusion one month ago. Also reports heartburn.  Contractions: Not present. Vag. Bleeding: None.  Movement: Present. Denies leaking of fluid.   The following portions of the patient's history were reviewed and updated as appropriate: allergies, current medications, past family history, past medical history, past social history, past surgical history and problem list.   Objective:   Vitals:   10/05/22 0954  BP: 104/69  Pulse: 82  Weight: 120 lb (54.4 kg)    Fetal Status: Fetal Heart Rate (bpm): 156   Movement: Present     General:  Alert, oriented and cooperative. Patient is in no acute distress.  Skin: Skin is warm and dry. No rash noted.   Cardiovascular: Normal heart rate noted  Respiratory: Normal respiratory effort, no problems with respiration noted  Abdomen: Soft, gravid, appropriate for gestational age.  Pain/Pressure: Absent     Pelvic: Cervical exam deferred        Extremities: Normal range of motion.  Edema: None  Mental Status: Normal mood and affect. Normal behavior. Normal judgment and thought content.   Assessment and Plan:  Pregnancy: G4P3003 at [redacted]w[redacted]d 1. Supervision of other normal pregnancy, antepartum BP and FHR normal Feeling regular movement Strict  precautions given regarding pain, when to go to hospital for pain or bleeding  2. Hyperemesis gravidarum Was getting weekly infusions, but that stopped one month ago Will reorder infusions  3. Heartburn during pregnancy in second trimester Trial of pepcid  - famotidine (PEPCID) 20 MG tablet; Take 1 tablet (20 mg total) by mouth at bedtime.  Dispense: 30 tablet; Refill: 0  4. [redacted] weeks gestation of pregnancy   Preterm labor symptoms and general obstetric precautions including but not limited to vaginal bleeding, contractions, leaking of fluid and fetal movement were reviewed in detail with the patient. Please refer to After Visit Summary for other counseling recommendations.   Return in about 4 weeks (around 11/02/2022) for OB VISIT (MD or APP).  Future Appointments  Date Time Provider Department Center  10/28/2022 10:30 AM [facility] US5 [facility]  11/02/2022 10:30 AM Rasch, Harolyn Rutherford, NP [facility]     Albertine Grates, FNP

## 2022-10-28 ENCOUNTER — Other Ambulatory Visit: Payer: Self-pay | Admitting: *Deleted

## 2022-10-28 ENCOUNTER — Ambulatory Visit: Payer: Medicaid Other | Attending: Maternal & Fetal Medicine

## 2022-10-28 ENCOUNTER — Other Ambulatory Visit: Payer: Self-pay

## 2022-10-28 DIAGNOSIS — Z3A24 24 weeks gestation of pregnancy: Secondary | ICD-10-CM | POA: Diagnosis not present

## 2022-10-28 DIAGNOSIS — Z362 Encounter for other antenatal screening follow-up: Secondary | ICD-10-CM | POA: Insufficient documentation

## 2022-10-28 DIAGNOSIS — O09292 Supervision of pregnancy with other poor reproductive or obstetric history, second trimester: Secondary | ICD-10-CM | POA: Diagnosis not present

## 2022-10-28 DIAGNOSIS — O34219 Maternal care for unspecified type scar from previous cesarean delivery: Secondary | ICD-10-CM

## 2022-11-02 ENCOUNTER — Ambulatory Visit (INDEPENDENT_AMBULATORY_CARE_PROVIDER_SITE_OTHER): Payer: Medicaid Other | Admitting: Obstetrics and Gynecology

## 2022-11-02 VITALS — BP 108/73 | HR 91 | Wt 118.0 lb

## 2022-11-02 DIAGNOSIS — R12 Heartburn: Secondary | ICD-10-CM

## 2022-11-02 DIAGNOSIS — Z3480 Encounter for supervision of other normal pregnancy, unspecified trimester: Secondary | ICD-10-CM

## 2022-11-02 DIAGNOSIS — Z3A24 24 weeks gestation of pregnancy: Secondary | ICD-10-CM

## 2022-11-02 DIAGNOSIS — O210 Mild hyperemesis gravidarum: Secondary | ICD-10-CM

## 2022-11-02 DIAGNOSIS — Z3482 Encounter for supervision of other normal pregnancy, second trimester: Secondary | ICD-10-CM

## 2022-11-02 DIAGNOSIS — O26892 Other specified pregnancy related conditions, second trimester: Secondary | ICD-10-CM

## 2022-11-02 MED ORDER — FAMOTIDINE 20 MG PO TABS
20.0000 mg | ORAL_TABLET | Freq: Two times a day (BID) | ORAL | 0 refills | Status: DC
Start: 2022-11-02 — End: 2022-12-14

## 2022-11-02 NOTE — Progress Notes (Signed)
Pt states she feels short of breath, dizzy and has acid reflux

## 2022-11-02 NOTE — Progress Notes (Addendum)
   PRENATAL VISIT NOTE  Subjective:  Dorothy Haas is a 32 y.o. [phone] at [redacted]w[redacted]d being seen today for ongoing prenatal care.  She is currently monitored for the following issues for this low-risk pregnancy and has History of cesarean delivery affecting pregnancy (G3 with uterine window); Female circumcision; Hyperemesis gravidarum; Uterine scar from previous surgery affecting pregnancy; History of Gestational thrombocytopenia (HCC); Supervision of other normal pregnancy, antepartum; Language barrier; GBS bacteriuria; and Alpha thalassemia silent carrier on their problem list.  Patient reports  Dizziness and heart racing at times .  Contractions: Not present. Vag. Bleeding: None.  Movement: Present. Denies leaking of fluid.   The following portions of the patient's history were reviewed and updated as appropriate: allergies, current medications, past family history, past medical history, past social history, past surgical history and problem list.   Objective:   Vitals:   11/02/22 1044  BP: 108/73  Pulse: 91  Weight: 118 lb (53.5 kg)    Fetal Status: Fetal Heart Rate (bpm): 155   Movement: Present     General:  Alert, oriented and cooperative. Patient is in no acute distress.  Skin: Skin is warm and dry. No rash noted.   Cardiovascular: Normal heart rate noted  Respiratory: Normal respiratory effort, no problems with respiration noted  Abdomen: Soft, gravid, appropriate for gestational age.  Pain/Pressure: Absent     Pelvic: Cervical exam deferred        Extremities: Normal range of motion.  Edema: None  Mental Status: Normal mood and affect. Normal behavior. Normal judgment and thought content.   Assessment and Plan:  Pregnancy: G4P3003 at [redacted]w[redacted]d  1. Supervision of other normal pregnancy, antepartum  - Suppressed TSH in early pregnancy, will check today. - TSH - T4, free  2. Heartburn during pregnancy in second trimester  - Declines medication for  Nausea/vomiting. - famotidine (PEPCID) 20 MG tablet; Take 1 tablet (20 mg total) by mouth 2 (two) times daily.  Dispense: 90 tablet; Refill: 0   3. Hyperemesis gravidarum  -Likely dehydrated, does not drink much water during the day.  - Urine negative for ketones today.    Preterm labor symptoms and general obstetric precautions including but not limited to vaginal bleeding, contractions, leaking of fluid and fetal movement were reviewed in detail with the patient. Please refer to After Visit Summary for other counseling recommendations.   Return Needs to see MD to discuss BTL, 2 hour GTT next visit..  Future Appointments  Date Time Provider Department Center  11/25/2022  9:00 AM [facility] NURSE [facility]  11/25/2022  9:30 AM Lennart Pall, MD [facility]  12/08/2022 10:30 AM [facility] US1 [facility]    Venia Carbon, NP

## 2022-11-03 LAB — T4, FREE: Free T4: 0.69 ng/dL — ABNORMAL LOW (ref 0.82–1.77)

## 2022-11-03 LAB — TSH: TSH: 1.96 u[IU]/mL (ref 0.450–4.500)

## 2022-11-25 ENCOUNTER — Ambulatory Visit: Payer: Medicaid Other | Admitting: Obstetrics and Gynecology

## 2022-11-25 ENCOUNTER — Ambulatory Visit: Payer: Medicaid Other

## 2022-11-25 VITALS — BP 101/66 | HR 91 | Wt 118.0 lb

## 2022-11-25 DIAGNOSIS — Z3480 Encounter for supervision of other normal pregnancy, unspecified trimester: Secondary | ICD-10-CM

## 2022-11-25 DIAGNOSIS — O210 Mild hyperemesis gravidarum: Secondary | ICD-10-CM

## 2022-11-25 DIAGNOSIS — Z148 Genetic carrier of other disease: Secondary | ICD-10-CM

## 2022-11-25 DIAGNOSIS — R8271 Bacteriuria: Secondary | ICD-10-CM

## 2022-11-25 DIAGNOSIS — O99820 Streptococcus B carrier state complicating pregnancy: Secondary | ICD-10-CM

## 2022-11-25 DIAGNOSIS — Z23 Encounter for immunization: Secondary | ICD-10-CM | POA: Diagnosis not present

## 2022-11-25 DIAGNOSIS — Z3A28 28 weeks gestation of pregnancy: Secondary | ICD-10-CM

## 2022-11-25 DIAGNOSIS — O34219 Maternal care for unspecified type scar from previous cesarean delivery: Secondary | ICD-10-CM

## 2022-11-25 DIAGNOSIS — Z302 Encounter for sterilization: Secondary | ICD-10-CM

## 2022-11-25 DIAGNOSIS — Z758 Other problems related to medical facilities and other health care: Secondary | ICD-10-CM

## 2022-11-25 DIAGNOSIS — O09293 Supervision of pregnancy with other poor reproductive or obstetric history, third trimester: Secondary | ICD-10-CM

## 2022-11-25 MED ORDER — METOCLOPRAMIDE HCL 10 MG PO TABS
10.0000 mg | ORAL_TABLET | Freq: Four times a day (QID) | ORAL | 2 refills | Status: DC | PRN
Start: 2022-11-25 — End: 2023-01-20

## 2022-11-25 NOTE — Progress Notes (Signed)
   PRENATAL VISIT NOTE  Subjective:  Dorothy Haas is a 32 y.o. [phone] at [redacted]w[redacted]d being seen today for ongoing prenatal care.  She is currently monitored for the following issues for this high-risk pregnancy and has History of cesarean delivery affecting pregnancy (G3 with uterine window); Female circumcision; Hyperemesis gravidarum; Uterine scar from previous surgery affecting pregnancy; History of Gestational thrombocytopenia (HCC); Supervision of other normal pregnancy, antepartum; Language barrier; GBS bacteriuria; and Alpha thalassemia silent carrier on their problem list.  Patient reports no complaints.  Contractions: Not present. Vag. Bleeding: None.  Movement: Present. Denies leaking of fluid.   The following portions of the patient's history were reviewed and updated as appropriate: allergies, current medications, past family history, past medical history, past social history, past surgical history and problem list.   Objective:   Vitals:   11/25/22 0911  BP: 101/66  Pulse: 91  Weight: 118 lb (53.5 kg)    Fetal Status: Fetal Heart Rate (bpm): 156   Movement: Present     General:  Alert, oriented and cooperative. Patient is in no acute distress.  Skin: Skin is warm and dry. No rash noted.   Cardiovascular: Normal heart rate noted  Respiratory: Normal respiratory effort, no problems with respiration noted  Abdomen: Soft, gravid, appropriate for gestational age.  Pain/Pressure: Absent      Assessment and Plan:  Pregnancy: G4P3003 at [redacted]w[redacted]d 1. Supervision of other normal pregnancy, antepartum 2. [redacted] weeks gestation of pregnancy Third trimester labs, 2h GTT today - Tdap vaccine greater than or equal to 7yo IM  3. History of cesarean delivery affecting pregnancy (G3 with uterine window) For repeat CS at 37 weeks  4. Request for sterilization - Discussed surgery of salpingectomy vs tubal ligation. Recommended salpingectomy due to lower risk of contraceptive failure  and potential ovarian cancer risk reduction.  - Reviewed procedure cannot be reversed even if tubal ligation was performed. If she were to desire future pregnancy, IVF would be needed.  - Discussed alternatives including LARC options and vasectomy. Reviewed risks/benefits of Nexplanon vs postplacental IUD vs interval IUD - Risks of surgery include but are not limited to: bleeding, infection, injury to surrounding organs/tissues (i.e. bowel/bladder/ureters), need for additional procedures, wound complications, hospital re-admission, and conversion to open surgery, VTE. We reviewed risk of contraceptive failure and risk of regret.  After discussion, Jaxon is undecided Medicaid tubal papers signed today in case she opts for permanent sterilization  5. Hyperemesis gravidarum Now tolerating PO but has persistent AM nausea/vomiting Rx for reglan sent Continue pepcid. Pt not taking zofran Discussed OTC medications for GERD  6. Language barrier Interviewed and counseled with in person arabic interpreter  7. GBS bacteriuria PCN in labor  Please refer to After Visit Summary for other counseling recommendations.   Return in about 2 weeks (around 12/09/2022).  Future Appointments  Date Time Provider Department Center  12/08/2022 10:30 AM [facility] US1 [facility]  12/15/2022  9:50 AM Milas Hock, MD [facility]  12/29/2022  9:30 AM Lennart Pall, MD [facility]  01/11/2023  9:30 AM Rasch, Harolyn Rutherford, NP [facility]    Lennart Pall, MD

## 2022-11-25 NOTE — Progress Notes (Signed)
Pt given orders for 28 week labs

## 2022-11-26 ENCOUNTER — Other Ambulatory Visit: Payer: Self-pay | Admitting: Obstetrics and Gynecology

## 2022-11-26 ENCOUNTER — Telehealth: Payer: Self-pay

## 2022-11-26 DIAGNOSIS — D696 Thrombocytopenia, unspecified: Secondary | ICD-10-CM

## 2022-11-26 DIAGNOSIS — D649 Anemia, unspecified: Secondary | ICD-10-CM | POA: Insufficient documentation

## 2022-11-26 DIAGNOSIS — D508 Other iron deficiency anemias: Secondary | ICD-10-CM

## 2022-11-26 LAB — CBC
Hematocrit: 31.3 % — ABNORMAL LOW (ref 34.0–46.6)
Hemoglobin: 9.7 g/dL — ABNORMAL LOW (ref 11.1–15.9)
MCH: 27.9 pg (ref 26.6–33.0)
MCHC: 31 g/dL — ABNORMAL LOW (ref 31.5–35.7)
MCV: 90 fL (ref 79–97)
Platelets: 135 10*3/uL — ABNORMAL LOW (ref 150–450)
RBC: 3.48 x10E6/uL — ABNORMAL LOW (ref 3.77–5.28)
RDW: 12.1 % (ref 11.7–15.4)
WBC: 5.9 10*3/uL (ref 3.4–10.8)

## 2022-11-26 LAB — HIV ANTIBODY (ROUTINE TESTING W REFLEX): HIV Screen 4th Generation wRfx: NONREACTIVE

## 2022-11-26 LAB — GLUCOSE TOLERANCE, 2 HOURS W/ 1HR
Glucose, 1 hour: 170 mg/dL (ref 70–179)
Glucose, 2 hour: 138 mg/dL (ref 70–152)
Glucose, Fasting: 90 mg/dL (ref 70–91)

## 2022-11-26 LAB — RPR: RPR Ser Ql: NONREACTIVE

## 2022-11-26 NOTE — Telephone Encounter (Signed)
Dr. Berton Lan, patient will be scheduled as soon as possible.  Auth Submission: NO AUTH NEEDED Site of care: Site of care: [facility] Payer: [employer] Medication & CPT/J Code(s) submitted: Venofer (Iron Sucrose) J1756 Route of submission (phone, fax, portal):  Phone # Fax # Auth type: Buy/Bill PB Units/visits requested: 200mg  x 5 doses Reference number:  Approval from: 11/26/22 to 01/11/23

## 2022-12-08 ENCOUNTER — Other Ambulatory Visit: Payer: Self-pay

## 2022-12-08 ENCOUNTER — Ambulatory Visit: Payer: Medicaid Other | Attending: Obstetrics

## 2022-12-08 ENCOUNTER — Other Ambulatory Visit: Payer: Self-pay | Admitting: *Deleted

## 2022-12-08 DIAGNOSIS — D563 Thalassemia minor: Secondary | ICD-10-CM | POA: Diagnosis not present

## 2022-12-08 DIAGNOSIS — O09293 Supervision of pregnancy with other poor reproductive or obstetric history, third trimester: Secondary | ICD-10-CM

## 2022-12-08 DIAGNOSIS — Z3A29 29 weeks gestation of pregnancy: Secondary | ICD-10-CM

## 2022-12-08 DIAGNOSIS — Z362 Encounter for other antenatal screening follow-up: Secondary | ICD-10-CM

## 2022-12-08 DIAGNOSIS — O34219 Maternal care for unspecified type scar from previous cesarean delivery: Secondary | ICD-10-CM | POA: Diagnosis present

## 2022-12-08 DIAGNOSIS — O99013 Anemia complicating pregnancy, third trimester: Secondary | ICD-10-CM

## 2022-12-14 NOTE — Progress Notes (Unsigned)
   PRENATAL VISIT NOTE  Subjective:  Dorothy Haas is a 32 y.o. [phone] at [redacted]w[redacted]d being seen today for ongoing prenatal care.  She is currently monitored for the following issues for this high-risk pregnancy and has History of cesarean delivery affecting pregnancy (G3 with uterine window); Female circumcision; Hyperemesis gravidarum; Uterine scar from previous surgery affecting pregnancy; Gestational thrombocytopenia (HCC); Supervision of other normal pregnancy, antepartum; Language barrier; GBS bacteriuria; Alpha thalassemia silent carrier; and Anemia on their problem list.  Patient reports {[id number]}.   .  .   . Denies leaking of fluid.   The following portions of the patient's history were reviewed and updated as appropriate: allergies, current medications, past family history, past medical history, past social history, past surgical history and problem list.   Objective:  There were no vitals filed for this visit.  Fetal Status:           General:  Alert, oriented and cooperative. Patient is in no acute distress.  Skin: Skin is warm and dry. No rash noted.   Cardiovascular: Normal heart rate noted  Respiratory: Normal respiratory effort, no problems with respiration noted  Abdomen: Soft, gravid, appropriate for gestational age.        Pelvic: Cervical exam deferred        Extremities: Normal range of motion.     Mental Status: Normal mood and affect. Normal behavior. Normal judgment and thought content.   Assessment and Plan:  Pregnancy: G4P3003 at [redacted]w[redacted]d 1. Supervision of other normal pregnancy, antepartum 28 wk labs wnl.   2. Language barrier Arabic interpreter used.   3. History of cesarean delivery affecting pregnancy (G3 with uterine window) Repeat scheduled for 1/16 at 37w. Amb ref submitted.   4. GBS bacteriuria PCN if labor (not planned)   5. Iron deficiency anemia secondary to inadequate dietary iron intake IV iron scheduled.   6. Benign gestational  thrombocytopenia in third trimester (HCC) Platelets 135  7. Alpha thalassemia silent carrier  8. Female circumcision  9. Pregnancy with 30 completed weeks gestation   Preterm labor symptoms and general obstetric precautions including but not limited to vaginal bleeding, contractions, leaking of fluid and fetal movement were reviewed in detail with the patient. Please refer to After Visit Summary for other counseling recommendations.   No follow-ups on file.  Future Appointments  Date Time Provider Department Center  12/15/2022  9:50 AM Milas Hock, MD [facility]  12/17/2022 10:00 AM [facility]IR 6 [facility] None  12/20/2022 10:00 AM [facility]CHAIR 5 [facility] None  12/22/2022 10:00 AM [facility]CHAIR 4 [facility] None  12/24/2022  9:45 AM [facility]IR 7 [facility] None  12/27/2022 10:00 AM [facility]CHAIR 2 [facility] None  12/29/2022  9:30 AM Lennart Pall, MD [facility]  01/11/2023  9:30 AM Rasch, Harolyn Rutherford, NP [facility]  01/13/2023 11:30 AM [facility] US5 [facility]    Milas Hock, MD

## 2022-12-15 ENCOUNTER — Encounter: Payer: Self-pay | Admitting: Obstetrics and Gynecology

## 2022-12-15 ENCOUNTER — Ambulatory Visit (INDEPENDENT_AMBULATORY_CARE_PROVIDER_SITE_OTHER): Payer: Medicaid Other | Admitting: Obstetrics and Gynecology

## 2022-12-15 VITALS — BP 104/72 | HR 86 | Wt 120.0 lb

## 2022-12-15 DIAGNOSIS — D696 Thrombocytopenia, unspecified: Secondary | ICD-10-CM

## 2022-12-15 DIAGNOSIS — O288 Other abnormal findings on antenatal screening of mother: Secondary | ICD-10-CM

## 2022-12-15 DIAGNOSIS — O99113 Other diseases of the blood and blood-forming organs and certain disorders involving the immune mechanism complicating pregnancy, third trimester: Secondary | ICD-10-CM

## 2022-12-15 DIAGNOSIS — Z3480 Encounter for supervision of other normal pregnancy, unspecified trimester: Secondary | ICD-10-CM

## 2022-12-15 DIAGNOSIS — R8271 Bacteriuria: Secondary | ICD-10-CM

## 2022-12-15 DIAGNOSIS — D508 Other iron deficiency anemias: Secondary | ICD-10-CM

## 2022-12-15 DIAGNOSIS — N90810 Female genital mutilation status, unspecified: Secondary | ICD-10-CM

## 2022-12-15 DIAGNOSIS — Z758 Other problems related to medical facilities and other health care: Secondary | ICD-10-CM

## 2022-12-15 DIAGNOSIS — O99820 Streptococcus B carrier state complicating pregnancy: Secondary | ICD-10-CM

## 2022-12-15 DIAGNOSIS — O34219 Maternal care for unspecified type scar from previous cesarean delivery: Secondary | ICD-10-CM

## 2022-12-15 DIAGNOSIS — D563 Thalassemia minor: Secondary | ICD-10-CM

## 2022-12-15 DIAGNOSIS — Z3A30 30 weeks gestation of pregnancy: Secondary | ICD-10-CM

## 2022-12-15 DIAGNOSIS — Z603 Acculturation difficulty: Secondary | ICD-10-CM

## 2022-12-15 DIAGNOSIS — O99013 Anemia complicating pregnancy, third trimester: Secondary | ICD-10-CM

## 2022-12-17 ENCOUNTER — Ambulatory Visit (INDEPENDENT_AMBULATORY_CARE_PROVIDER_SITE_OTHER): Payer: Medicaid Other

## 2022-12-17 VITALS — BP 96/63 | HR 85 | Temp 98.6°F | Resp 16 | Ht 62.0 in | Wt 120.0 lb

## 2022-12-17 DIAGNOSIS — O99013 Anemia complicating pregnancy, third trimester: Secondary | ICD-10-CM

## 2022-12-17 DIAGNOSIS — Z3A31 31 weeks gestation of pregnancy: Secondary | ICD-10-CM | POA: Diagnosis not present

## 2022-12-17 DIAGNOSIS — D508 Other iron deficiency anemias: Secondary | ICD-10-CM | POA: Diagnosis not present

## 2022-12-17 MED ORDER — ACETAMINOPHEN 325 MG PO TABS
650.0000 mg | ORAL_TABLET | Freq: Once | ORAL | Status: AC
Start: 2022-12-17 — End: 2022-12-17
  Administered 2022-12-17: 650 mg via ORAL
  Filled 2022-12-17: qty 2

## 2022-12-17 MED ORDER — IRON SUCROSE 20 MG/ML IV SOLN
200.0000 mg | Freq: Once | INTRAVENOUS | Status: AC
  Administered 2022-12-17: 200 mg via INTRAVENOUS
  Filled 2022-12-17: qty 10

## 2022-12-17 MED ORDER — DIPHENHYDRAMINE HCL 25 MG PO CAPS
25.0000 mg | ORAL_CAPSULE | Freq: Once | ORAL | Status: AC
  Administered 2022-12-17: 25 mg via ORAL
  Filled 2022-12-17: qty 1

## 2022-12-17 NOTE — Patient Instructions (Signed)
Iron Sucrose Injection What is this medication? IRON SUCROSE (EYE ern SOO krose) treats low levels of iron (iron deficiency anemia) in people with kidney disease. Iron is a mineral that plays an important role in making red blood cells, which carry oxygen from your lungs to the rest of your body. This medicine may be used for other purposes; ask your health care provider or pharmacist if you have questions. COMMON BRAND NAME(S): Venofer What should I tell my care team before I take this medication? They need to know if you have any of these conditions: Anemia not caused by low iron levels Heart disease High levels of iron in the blood Kidney disease Liver disease An unusual or allergic reaction to iron, other medications, foods, dyes, or preservatives Pregnant or trying to get pregnant Breastfeeding How should I use this medication? This medication is for infusion into a vein. It is given in a hospital or clinic setting. Talk to your care team about the use of this medication in children. While this medication may be prescribed for children as young as 2 years for selected conditions, precautions do apply. Overdosage: If you think you have taken too much of this medicine contact a poison control center or emergency room at once. NOTE: This medicine is only for you. Do not share this medicine with others. What if I miss a dose? Keep appointments for follow-up doses. It is important not to miss your dose. Call your care team if you are unable to keep an appointment. What may interact with this medication? Do not take this medication with any of the following: Deferoxamine Dimercaprol Other iron products This medication may also interact with the following: Chloramphenicol Deferasirox This list may not describe all possible interactions. Give your health care provider a list of all the medicines, herbs, non-prescription drugs, or dietary supplements you use. Also tell them if you smoke,  drink alcohol, or use illegal drugs. Some items may interact with your medicine. What should I watch for while using this medication? Visit your care team regularly. Tell your care team if your symptoms do not start to get better or if they get worse. You may need blood work done while you are taking this medication. You may need to follow a special diet. Talk to your care team. Foods that contain iron include: whole grains/cereals, dried fruits, beans, or peas, leafy green vegetables, and organ meats (liver, kidney). What side effects may I notice from receiving this medication? Side effects that you should report to your care team as soon as possible: Allergic reactions--skin rash, itching, hives, swelling of the face, lips, tongue, or throat Low blood pressure--dizziness, feeling faint or lightheaded, blurry vision Shortness of breath Side effects that usually do not require medical attention (report to your care team if they continue or are bothersome): Flushing Headache Joint pain Muscle pain Nausea Pain, redness, or irritation at injection site This list may not describe all possible side effects. Call your doctor for medical advice about side effects. You may report side effects to FDA at 1-800-FDA-1088. Where should I keep my medication? This medication is given in a hospital or clinic. It will not be stored at home. NOTE: This sheet is a summary. It may not cover all possible information. If you have questions about this medicine, talk to your doctor, pharmacist, or health care provider.  2024 Elsevier/Gold Standard (2022-06-04 00:00:00)

## 2022-12-17 NOTE — Progress Notes (Signed)
 Diagnosis: Iron Deficiency Anemia  Provider:  Chilton Greathouse MD  Procedure: IV Push  IV Type: Peripheral, IV Location: R Forearm  Venofer (Iron Sucrose), Dose: 200 mg  Post Infusion IV Care: Observation period completed and Peripheral IV Discontinued  Discharge: Condition: Good, Destination: Home . AVS Provided  Performed by:  Adriana Mccallum, RN

## 2022-12-20 ENCOUNTER — Ambulatory Visit: Payer: Medicaid Other

## 2022-12-20 VITALS — BP 100/65 | HR 85 | Temp 98.2°F | Resp 16 | Ht 62.0 in | Wt 116.4 lb

## 2022-12-20 DIAGNOSIS — O99013 Anemia complicating pregnancy, third trimester: Secondary | ICD-10-CM | POA: Diagnosis not present

## 2022-12-20 DIAGNOSIS — Z3A31 31 weeks gestation of pregnancy: Secondary | ICD-10-CM

## 2022-12-20 DIAGNOSIS — D508 Other iron deficiency anemias: Secondary | ICD-10-CM

## 2022-12-20 MED ORDER — DIPHENHYDRAMINE HCL 25 MG PO CAPS
25.0000 mg | ORAL_CAPSULE | Freq: Once | ORAL | Status: AC
  Administered 2022-12-20: 25 mg via ORAL
  Filled 2022-12-20: qty 1

## 2022-12-20 MED ORDER — IRON SUCROSE 20 MG/ML IV SOLN
200.0000 mg | Freq: Once | INTRAVENOUS | Status: AC
  Administered 2022-12-20: 200 mg via INTRAVENOUS
  Filled 2022-12-20: qty 10

## 2022-12-20 MED ORDER — ACETAMINOPHEN 325 MG PO TABS
650.0000 mg | ORAL_TABLET | Freq: Once | ORAL | Status: AC
  Administered 2022-12-20: 650 mg via ORAL
  Filled 2022-12-20: qty 2

## 2022-12-20 NOTE — Progress Notes (Signed)
 Diagnosis: Iron Deficiency Anemia  Provider:  Chilton Greathouse MD  Procedure: IV Push  IV Type: Peripheral, IV Location: L Antecubital  Venofer (Iron Sucrose), Dose: 200 mg  Post Infusion IV Care: Observation period completed and Peripheral IV Discontinued  Discharge: Condition: Good, Destination: Home . AVS Provided  Performed by:  Rico Ala, LPN

## 2022-12-22 ENCOUNTER — Ambulatory Visit: Payer: Medicaid Other

## 2022-12-22 VITALS — BP 92/55 | HR 90 | Temp 98.0°F | Resp 18 | Ht 62.0 in | Wt 116.6 lb

## 2022-12-22 DIAGNOSIS — D509 Iron deficiency anemia, unspecified: Secondary | ICD-10-CM | POA: Diagnosis not present

## 2022-12-22 DIAGNOSIS — D508 Other iron deficiency anemias: Secondary | ICD-10-CM

## 2022-12-22 MED ORDER — IRON SUCROSE 20 MG/ML IV SOLN
200.0000 mg | Freq: Once | INTRAVENOUS | Status: AC
Start: 2022-12-22 — End: 2022-12-22
  Administered 2022-12-22: 200 mg via INTRAVENOUS
  Filled 2022-12-22: qty 10

## 2022-12-22 MED ORDER — DIPHENHYDRAMINE HCL 25 MG PO CAPS
25.0000 mg | ORAL_CAPSULE | Freq: Once | ORAL | Status: AC
  Administered 2022-12-22: 25 mg via ORAL
  Filled 2022-12-22: qty 1

## 2022-12-22 MED ORDER — ACETAMINOPHEN 325 MG PO TABS
650.0000 mg | ORAL_TABLET | Freq: Once | ORAL | Status: AC
  Administered 2022-12-22: 650 mg via ORAL
  Filled 2022-12-22: qty 2

## 2022-12-22 NOTE — Progress Notes (Signed)
Diagnosis: Iron Deficiency Anemia  Provider:  Chilton Greathouse MD  Procedure: IV Push  IV Type: Peripheral, IV Location: R Forearm  Venofer (Iron Sucrose), Dose: 200 mg  Post Infusion IV Care: Observation period completed and Peripheral IV Discontinued  Discharge: Condition: Good, Destination: Home . AVS Declined  Performed by:  Garnette Czech, RN

## 2022-12-24 ENCOUNTER — Ambulatory Visit: Payer: Medicaid Other

## 2022-12-24 VITALS — BP 99/66 | HR 82 | Temp 97.9°F | Resp 14 | Ht 62.0 in | Wt 118.4 lb

## 2022-12-24 DIAGNOSIS — D508 Other iron deficiency anemias: Secondary | ICD-10-CM

## 2022-12-24 DIAGNOSIS — D509 Iron deficiency anemia, unspecified: Secondary | ICD-10-CM

## 2022-12-24 MED ORDER — IRON SUCROSE 20 MG/ML IV SOLN
200.0000 mg | Freq: Once | INTRAVENOUS | Status: AC
  Administered 2022-12-24: 200 mg via INTRAVENOUS
  Filled 2022-12-24: qty 10

## 2022-12-24 MED ORDER — ACETAMINOPHEN 325 MG PO TABS
650.0000 mg | ORAL_TABLET | Freq: Once | ORAL | Status: AC
  Administered 2022-12-24: 650 mg via ORAL
  Filled 2022-12-24: qty 2

## 2022-12-24 MED ORDER — DIPHENHYDRAMINE HCL 25 MG PO CAPS
25.0000 mg | ORAL_CAPSULE | Freq: Once | ORAL | Status: AC
  Administered 2022-12-24: 25 mg via ORAL
  Filled 2022-12-24: qty 1

## 2022-12-24 NOTE — Progress Notes (Signed)
Diagnosis: Acute Anemia  Provider:  Chilton Greathouse MD  Procedure: IV Push  IV Type: Peripheral, IV Location: L Antecubital  Venofer (Iron Sucrose), Dose: 200 mg  Post Infusion IV Care: Observation period completed  Discharge: Condition: Good, Destination: Home . AVS Provided  Performed by:  Wyvonne Lenz, RN

## 2022-12-26 MED ORDER — ACETAMINOPHEN 325 MG PO TABS: 650.0000 mg | ORAL_TABLET | Freq: Once | ORAL | Status: DC

## 2022-12-26 MED ORDER — IRON SUCROSE 20 MG/ML IV SOLN: 200.0000 mg | Freq: Once | INTRAVENOUS | Status: DC

## 2022-12-26 MED ORDER — DIPHENHYDRAMINE HCL 25 MG PO CAPS
25.0000 mg | ORAL_CAPSULE | Freq: Once | ORAL | Status: DC
Start: 2022-12-27 — End: 2022-12-27

## 2022-12-27 ENCOUNTER — Other Ambulatory Visit: Payer: Self-pay | Admitting: *Deleted

## 2022-12-27 ENCOUNTER — Encounter: Payer: Self-pay | Admitting: *Deleted

## 2022-12-27 MED ORDER — ONDANSETRON 4 MG PO TBDP: 4.0000 mg | ORAL_TABLET | Freq: Three times a day (TID) | ORAL | 0 refills | Status: DC | PRN

## 2022-12-27 NOTE — Progress Notes (Signed)
Pt showed up in office today stating that she still has a headache.  She was seen in ED last night and NST was reactive and labs were WNL except for dehydration.  She stated that she last took Tylenol last night around 7 PM.  I explained that the headache isn't getting any better without taking Tylenol every 4 hrs and keeping fluids down.  Will send in a RX for Zofran.  Pt has agreed to take her Tylenol as directed.  If she isn't feeling any better within 12 hrs she is to return to [facility]U.

## 2022-12-29 ENCOUNTER — Ambulatory Visit (INDEPENDENT_AMBULATORY_CARE_PROVIDER_SITE_OTHER): Payer: Medicaid Other | Admitting: Obstetrics and Gynecology

## 2022-12-29 ENCOUNTER — Ambulatory Visit: Payer: Medicaid Other

## 2022-12-29 VITALS — BP 105/71 | HR 90 | Wt 116.0 lb

## 2022-12-29 VITALS — BP 94/62 | HR 97 | Temp 98.4°F | Resp 18 | Ht 62.0 in | Wt 120.6 lb

## 2022-12-29 DIAGNOSIS — O99820 Streptococcus B carrier state complicating pregnancy: Secondary | ICD-10-CM

## 2022-12-29 DIAGNOSIS — D509 Iron deficiency anemia, unspecified: Secondary | ICD-10-CM | POA: Diagnosis not present

## 2022-12-29 DIAGNOSIS — D508 Other iron deficiency anemias: Secondary | ICD-10-CM

## 2022-12-29 DIAGNOSIS — Z3A32 32 weeks gestation of pregnancy: Secondary | ICD-10-CM

## 2022-12-29 DIAGNOSIS — Z3480 Encounter for supervision of other normal pregnancy, unspecified trimester: Secondary | ICD-10-CM

## 2022-12-29 DIAGNOSIS — Z758 Other problems related to medical facilities and other health care: Secondary | ICD-10-CM

## 2022-12-29 DIAGNOSIS — O34219 Maternal care for unspecified type scar from previous cesarean delivery: Secondary | ICD-10-CM

## 2022-12-29 DIAGNOSIS — Z603 Acculturation difficulty: Secondary | ICD-10-CM

## 2022-12-29 DIAGNOSIS — D696 Thrombocytopenia, unspecified: Secondary | ICD-10-CM

## 2022-12-29 DIAGNOSIS — R8271 Bacteriuria: Secondary | ICD-10-CM

## 2022-12-29 DIAGNOSIS — D563 Thalassemia minor: Secondary | ICD-10-CM

## 2022-12-29 DIAGNOSIS — O99113 Other diseases of the blood and blood-forming organs and certain disorders involving the immune mechanism complicating pregnancy, third trimester: Secondary | ICD-10-CM

## 2022-12-29 DIAGNOSIS — R519 Headache, unspecified: Secondary | ICD-10-CM

## 2022-12-29 MED ORDER — IRON SUCROSE 20 MG/ML IV SOLN
200.0000 mg | Freq: Once | INTRAVENOUS | Status: AC
  Administered 2022-12-29: 200 mg via INTRAVENOUS
  Filled 2022-12-29: qty 10

## 2022-12-29 MED ORDER — DIPHENHYDRAMINE HCL 25 MG PO CAPS
25.0000 mg | ORAL_CAPSULE | Freq: Once | ORAL | Status: AC
  Administered 2022-12-29: 25 mg via ORAL
  Filled 2022-12-29: qty 1

## 2022-12-29 MED ORDER — ACETAMINOPHEN 325 MG PO TABS
650.0000 mg | ORAL_TABLET | Freq: Once | ORAL | Status: AC
  Administered 2022-12-29: 650 mg via ORAL
  Filled 2022-12-29: qty 2

## 2022-12-29 NOTE — Progress Notes (Signed)
   PRENATAL VISIT NOTE  Subjective:  Dorothy Haas is a 32 y.o. [phone] at [redacted]w[redacted]d being seen today for ongoing prenatal care.  She is currently monitored for the following issues for this high-risk pregnancy and has History of cesarean delivery affecting pregnancy (G3 with uterine window); Female circumcision; Hyperemesis gravidarum; Uterine scar from previous surgery affecting pregnancy; Gestational thrombocytopenia (HCC); Supervision of other normal pregnancy, antepartum; Language barrier; GBS bacteriuria; Alpha thalassemia silent carrier; and Anemia on their problem list.  Patient reports  headache x 4 days and vomiting in AM. Seen at [facility] on 12/15 - given 1g tylenol and 500cc bolus and HA Improved to 2 out of 10. RVP negative, no significant electrolyte abnormality. Tolerating PO  .  Contractions: Irritability. Vag. Bleeding: None.  Movement: Present. Denies leaking of fluid.   The following portions of the patient's history were reviewed and updated as appropriate: allergies, current medications, past family history, past medical history, past social history, past surgical history and problem list.   Objective:   Vitals:   12/29/22 0948  BP: 105/71  Pulse: 90  Weight: 116 lb (52.6 kg)    Fetal Status: Fetal Heart Rate (bpm): 153   Movement: Present     General:  Alert, oriented and cooperative. Patient is in no acute distress.  Skin: Skin is warm and dry. No rash noted.   Cardiovascular: Normal heart rate noted  Respiratory: Normal respiratory effort, no problems with respiration noted  Abdomen: Soft, gravid, appropriate for gestational age.  Pain/Pressure: Present      Assessment and Plan:  Pregnancy: G4P3003 at [redacted]w[redacted]d 1. Supervision of other normal pregnancy, antepartum (Primary) 2. [redacted] weeks gestation of pregnancy Growth [place] scheduled 1/2  3. History of cesarean delivery affecting pregnancy (G3 with uterine window) For repeat with BTL on 1/16  4. Benign  gestational thrombocytopenia in third trimester (HCC) Plt 117 on 12/15 at [facility] Repeat CBC at 36w  5. Language barrier Pt interviewed and counseled with Arabic interpreter (in person)  6. GBS bacteriuria  7. Alpha thalassemia silent carrier  8. Iron deficiency anemia secondary to inadequate dietary iron intake S/p IV iron - last dose today Hgb 9 on 12/15 at [facility]  9. Nonintractable headache, unspecified chronicity pattern, unspecified headache type Discussed Excedrin tension headache & hydration. if symptoms persist, would present to [facility]U for symptomatic management/additional work up as indicated   Please refer to After Visit Summary for other counseling recommendations.   Return in about 2 weeks (around 01/12/2023) for return OB at 34 weeks.  Future Appointments  Date Time Provider Department Center  12/29/2022  2:30 PM [facility]IR 3 [facility] None  01/11/2023  9:30 AM Rasch, Harolyn Rutherford, NP [facility]  01/13/2023 11:30 AM [facility] US5 [facility]  01/25/2023  9:50 AM Rasch, Harolyn Rutherford, NP [facility]   Lennart Pall, MD

## 2022-12-29 NOTE — Progress Notes (Signed)
Diagnosis: Acute Anemia  Provider:  Chilton Greathouse MD  Procedure: IV Push  IV Type: Peripheral, IV Location: L Antecubital  Venofer (Iron Sucrose), Dose: 200 mg  Post Infusion IV Care: Observation period completed and Peripheral IV Discontinued  Discharge: Condition: Good, Destination: Home . AVS Declined  Performed by:  Wyvonne Lenz, RN

## 2022-12-29 NOTE — Progress Notes (Addendum)
Pt reports an ongoing HA. She reports she has been taking her Tylenol and Zofran. She reports her HA is 10/10.

## 2023-01-10 ENCOUNTER — Other Ambulatory Visit: Payer: Self-pay | Admitting: Family Medicine

## 2023-01-10 DIAGNOSIS — O34219 Maternal care for unspecified type scar from previous cesarean delivery: Secondary | ICD-10-CM

## 2023-01-10 DIAGNOSIS — O3429 Maternal care due to uterine scar from other previous surgery: Secondary | ICD-10-CM

## 2023-01-11 ENCOUNTER — Ambulatory Visit (INDEPENDENT_AMBULATORY_CARE_PROVIDER_SITE_OTHER): Payer: Medicaid Other | Admitting: Obstetrics and Gynecology

## 2023-01-11 VITALS — BP 107/73 | HR 73 | Temp 98.4°F | Wt 117.0 lb

## 2023-01-11 DIAGNOSIS — O3429 Maternal care due to uterine scar from other previous surgery: Secondary | ICD-10-CM

## 2023-01-11 DIAGNOSIS — Z3480 Encounter for supervision of other normal pregnancy, unspecified trimester: Secondary | ICD-10-CM

## 2023-01-11 MED ORDER — BUTALBITAL-APAP-CAFFEINE 50-325-40 MG PO TABS: 2.0000 | ORAL_TABLET | Freq: Four times a day (QID) | ORAL | 0 refills | Status: DC | PRN

## 2023-01-11 NOTE — Patient Instructions (Addendum)

## 2023-01-11 NOTE — Progress Notes (Signed)
C/O Headache and congestion

## 2023-01-11 NOTE — Progress Notes (Signed)
   PRENATAL VISIT NOTE  Subjective:  Dorothy Haas is a 32 y.o. [phone] at [redacted]w[redacted]d being seen today for ongoing prenatal care.  She is currently monitored for the following issues for this high-risk pregnancy and has History of cesarean delivery affecting pregnancy (G3 with uterine window); Female circumcision; Hyperemesis gravidarum; Uterine scar from previous surgery affecting pregnancy; Gestational thrombocytopenia (HCC); Supervision of other normal pregnancy, antepartum; Language barrier; GBS bacteriuria; Alpha thalassemia silent carrier; and Anemia on their problem list.  Patient reports no complaints.  Contractions: Not present. Vag. Bleeding: None.  Movement: Present. Denies leaking of fluid.   The following portions of the patient's history were reviewed and updated as appropriate: allergies, current medications, past family history, past medical history, past social history, past surgical history and problem list.   Objective:   Vitals:   01/11/23 1035  BP: 107/73  Pulse: 73  Temp: 98.4 F (36.9 C)  Weight: 117 lb (53.1 kg)    Fetal Status: Fetal Heart Rate (bpm): 139   Movement: Present     General:  Alert, oriented and cooperative. Patient is in no acute distress.  Skin: Skin is warm and dry. No rash noted.   Cardiovascular: Normal heart rate noted  Respiratory: Normal respiratory effort, no problems with respiration noted  Abdomen: Soft, gravid, appropriate for gestational age.  Pain/Pressure: Present     Pelvic: Cervical exam deferred        Extremities: Normal range of motion.  Edema: None  Mental Status: Normal mood and affect. Normal behavior. Normal judgment and thought content.   Assessment and Plan:  Pregnancy: G4P3003 at [redacted]w[redacted]d   1. Supervision of other normal pregnancy, antepartum (Primary)  Occasional contractions. Comes and goes.  Discussed going to MAU if contractions become stronger.  Has had HA off and on for a few weeks. Has also had  Cold/cough.  Feels dehydrated,  unable to give urine sample here.   2. Uterine scar from previous surgery affecting pregnancy  C/s scheduled for 01/27/2023  Preterm labor symptoms and general obstetric precautions including but not limited to vaginal bleeding, contractions, leaking of fluid and fetal movement were reviewed in detail with the patient. Please refer to After Visit Summary for other counseling recommendations.   No follow-ups on file.  Future Appointments  Date Time Provider Department Center  01/13/2023 11:30 AM [facility] US5 [facility]  01/25/2023  9:50 AM Birtha Hatler, Delon FERNS, NP [facility]    Delon Emms, NP

## 2023-01-13 ENCOUNTER — Ambulatory Visit: Payer: Medicaid Other | Attending: Maternal & Fetal Medicine

## 2023-01-13 ENCOUNTER — Other Ambulatory Visit: Payer: Self-pay

## 2023-01-13 DIAGNOSIS — O09293 Supervision of pregnancy with other poor reproductive or obstetric history, third trimester: Secondary | ICD-10-CM

## 2023-01-13 DIAGNOSIS — O99013 Anemia complicating pregnancy, third trimester: Secondary | ICD-10-CM

## 2023-01-13 DIAGNOSIS — D563 Thalassemia minor: Secondary | ICD-10-CM

## 2023-01-13 DIAGNOSIS — Z362 Encounter for other antenatal screening follow-up: Secondary | ICD-10-CM | POA: Insufficient documentation

## 2023-01-13 DIAGNOSIS — O34219 Maternal care for unspecified type scar from previous cesarean delivery: Secondary | ICD-10-CM

## 2023-01-13 DIAGNOSIS — Z3A35 35 weeks gestation of pregnancy: Secondary | ICD-10-CM

## 2023-01-13 NOTE — Patient Instructions (Signed)
 Dorothy Haas  01/13/2023   Your procedure is scheduled on:  01/27/2023  Arrive at 0745 at Entrance C on [address] at [facility]  and [employer]. You are invited to use the FREE valet parking or use the Visitor's parking deck.  Pick up the phone at the desk and dial [phone].  Call this number if you have problems the morning of surgery: [phone]  Remember:   Do not eat food:(After Midnight) Desps de medianoche.  You may drink clear liquids until arrival at ___0745__.  Clear liquids means a liquid you can see thru.  It can have color such as Cola or Kool aid.  Tea is OK and coffee as long as no milk or creamer of any kind.  Take these medicines the morning of surgery with A SIP OF WATER :  none   Do not wear jewelry, make-up or nail polish.  Do not wear lotions, powders, or perfumes. Do not wear deodorant.  Do not shave 48 hours prior to surgery.  Do not bring valuables to the hospital.  [facility] is not   responsible for any belongings or valuables brought to the hospital.  Contacts, dentures or bridgework may not be worn into surgery.  Leave suitcase in the car. After surgery it may be brought to your room.  For patients admitted to the hospital, checkout time is 11:00 AM the day of              discharge.      Please read over the following fact sheets that you were given:     Preparing for Surgery

## 2023-01-14 ENCOUNTER — Telehealth (HOSPITAL_COMMUNITY): Payer: Self-pay | Admitting: *Deleted

## 2023-01-14 ENCOUNTER — Encounter (HOSPITAL_COMMUNITY): Payer: Self-pay

## 2023-01-14 NOTE — Telephone Encounter (Signed)
 Preadmission screen Interpreter number [phone]

## 2023-01-25 ENCOUNTER — Ambulatory Visit (INDEPENDENT_AMBULATORY_CARE_PROVIDER_SITE_OTHER): Payer: Medicaid Other | Admitting: Obstetrics and Gynecology

## 2023-01-25 ENCOUNTER — Encounter (HOSPITAL_COMMUNITY)
Admission: RE | Admit: 2023-01-25 | Discharge: 2023-01-25 | Disposition: A | Discharge status: Home / Self Care | Payer: Medicaid Other | Source: Ambulatory Visit | Attending: Family Medicine | Admitting: Family Medicine

## 2023-01-25 ENCOUNTER — Inpatient Hospital Stay (HOSPITAL_COMMUNITY): Admission: RE | Admit: 2023-01-25 | Payer: Medicaid Other | Source: Ambulatory Visit

## 2023-01-25 ENCOUNTER — Other Ambulatory Visit (HOSPITAL_COMMUNITY)
Admission: RE | Admit: 2023-01-25 | Discharge: 2023-01-25 | Disposition: A | Discharge status: Home / Self Care | Payer: Medicaid Other | Source: Ambulatory Visit | Attending: Obstetrics and Gynecology | Admitting: Obstetrics and Gynecology

## 2023-01-25 VITALS — BP 118/74 | HR 83 | Wt 118.0 lb

## 2023-01-25 DIAGNOSIS — Z01812 Encounter for preprocedural laboratory examination: Secondary | ICD-10-CM | POA: Insufficient documentation

## 2023-01-25 DIAGNOSIS — Z3480 Encounter for supervision of other normal pregnancy, unspecified trimester: Secondary | ICD-10-CM

## 2023-01-25 DIAGNOSIS — Z3483 Encounter for supervision of other normal pregnancy, third trimester: Secondary | ICD-10-CM

## 2023-01-25 DIAGNOSIS — O34219 Maternal care for unspecified type scar from previous cesarean delivery: Secondary | ICD-10-CM | POA: Insufficient documentation

## 2023-01-25 DIAGNOSIS — O3429 Maternal care due to uterine scar from other previous surgery: Secondary | ICD-10-CM | POA: Insufficient documentation

## 2023-01-25 HISTORY — DX: Anemia, unspecified: D64.9

## 2023-01-25 LAB — CBC
HCT: 39.7 % (ref 36.0–46.0)
Hemoglobin: 12.3 g/dL (ref 12.0–15.0)
MCH: 28 pg (ref 26.0–34.0)
MCHC: 31 g/dL (ref 30.0–36.0)
MCV: 90.2 fL (ref 80.0–100.0)
Platelets: 93 10*3/uL — ABNORMAL LOW (ref 150–400)
RBC: 4.4 MIL/uL (ref 3.87–5.11)
RDW: 19.9 % — ABNORMAL HIGH (ref 11.5–15.5)
WBC: 4.1 10*3/uL (ref 4.0–10.5)
nRBC: 0 % (ref 0.0–0.2)

## 2023-01-25 LAB — TYPE AND SCREEN
ABO/RH(D): O POS
Antibody Screen: NEGATIVE

## 2023-01-25 LAB — RAPID HIV SCREEN (HIV 1/2 AB+AG)
HIV 1/2 Antibodies: NONREACTIVE
HIV-1 P24 Antigen - HIV24: NONREACTIVE

## 2023-01-25 LAB — RPR: RPR Ser Ql: NONREACTIVE

## 2023-01-25 NOTE — Progress Notes (Signed)
   PRENATAL VISIT NOTE  Subjective:  Dorothy Haas is a 33 y.o. [phone] at [redacted]w[redacted]d being seen today for ongoing prenatal care.  She is currently monitored for the following issues for this low-risk pregnancy and has History of cesarean delivery affecting pregnancy (G3 with uterine window); Female circumcision; Hyperemesis gravidarum; Uterine scar from previous surgery affecting pregnancy; Gestational thrombocytopenia (HCC); Supervision of other normal pregnancy, antepartum; Language barrier; GBS bacteriuria; Alpha thalassemia silent carrier; and Anemia on their problem list.  Patient reports no complaints.  Contractions: Not present. Vag. Bleeding: None.  Movement: Present. Denies leaking of fluid.   The following portions of the patient's history were reviewed and updated as appropriate: allergies, current medications, past family history, past medical history, past social history, past surgical history and problem list.   Objective:   Vitals:   01/25/23 1348  BP: 118/74  Pulse: 83  Weight: 118 lb (53.5 kg)    Fetal Status: Fetal Heart Rate (bpm): 136 Fundal Height: 37 cm Movement: Present     General:  Alert, oriented and cooperative. Patient is in no acute distress.  Skin: Skin is warm and dry. No rash noted.   Cardiovascular: Normal heart rate noted  Respiratory: Normal respiratory effort, no problems with respiration noted  Abdomen: Soft, gravid, appropriate for gestational age.  Pain/Pressure: Present     Pelvic: Cervical exam deferred        Extremities: Normal range of motion.  Edema: None  Mental Status: Normal mood and affect. Normal behavior. Normal judgment and thought content.   Assessment and Plan:  Pregnancy: G4P3003 at [redacted]w[redacted]d 1. Supervision of other normal pregnancy, antepartum (Primary)  Interpretor present:  - repeat C/s scheduled for tomorrow.  - Culture, beta strep (group b only) - Cervicovaginal ancillary only( [place]) - Planning BTS: Consent  signed 11/25/22  Preterm labor symptoms and general obstetric precautions including but not limited to vaginal bleeding, contractions, leaking of fluid and fetal movement were reviewed in detail with the patient. Please refer to After Visit Summary for other counseling recommendations.   No follow-ups on file.  Future Appointments  Date Time Provider Department Center  02/03/2023  9:50 AM Cleatus Moccasin, MD [facility]  03/10/2023  9:50 AM Erik Kieth BROCKS, MD [facility]    Delon Emms, NP

## 2023-01-26 LAB — CERVICOVAGINAL ANCILLARY ONLY
Chlamydia: NEGATIVE
Comment: NEGATIVE
Comment: NORMAL
Neisseria Gonorrhea: NEGATIVE

## 2023-01-27 ENCOUNTER — Inpatient Hospital Stay (HOSPITAL_COMMUNITY): Payer: Medicaid Other

## 2023-01-27 ENCOUNTER — Other Ambulatory Visit: Payer: Self-pay

## 2023-01-27 ENCOUNTER — Encounter (HOSPITAL_COMMUNITY)
Admission: RE | Disposition: A | Discharge status: Home / Self Care | Payer: Self-pay | Source: Home / Self Care | Attending: Family Medicine

## 2023-01-27 ENCOUNTER — Encounter (HOSPITAL_COMMUNITY): Payer: Self-pay | Admitting: Family Medicine

## 2023-01-27 ENCOUNTER — Inpatient Hospital Stay (HOSPITAL_COMMUNITY)
Admission: RE | Admit: 2023-01-27 | Discharge: 2023-01-29 | DRG: 787 | Disposition: A | Discharge status: Home / Self Care | Payer: Medicaid Other | Attending: Family Medicine | Admitting: Family Medicine

## 2023-01-27 DIAGNOSIS — O9962 Diseases of the digestive system complicating childbirth: Secondary | ICD-10-CM | POA: Diagnosis present

## 2023-01-27 DIAGNOSIS — O3429 Maternal care due to uterine scar from other previous surgery: Secondary | ICD-10-CM

## 2023-01-27 DIAGNOSIS — Z3A37 37 weeks gestation of pregnancy: Secondary | ICD-10-CM

## 2023-01-27 DIAGNOSIS — D696 Thrombocytopenia, unspecified: Secondary | ICD-10-CM | POA: Diagnosis present

## 2023-01-27 DIAGNOSIS — K219 Gastro-esophageal reflux disease without esophagitis: Secondary | ICD-10-CM | POA: Diagnosis present

## 2023-01-27 DIAGNOSIS — Z30430 Encounter for insertion of intrauterine contraceptive device: Secondary | ICD-10-CM

## 2023-01-27 DIAGNOSIS — O99820 Streptococcus B carrier state complicating pregnancy: Secondary | ICD-10-CM | POA: Diagnosis not present

## 2023-01-27 DIAGNOSIS — Z833 Family history of diabetes mellitus: Secondary | ICD-10-CM

## 2023-01-27 DIAGNOSIS — O34211 Maternal care for low transverse scar from previous cesarean delivery: Principal | ICD-10-CM | POA: Diagnosis present

## 2023-01-27 DIAGNOSIS — Z888 Allergy status to other drugs, medicaments and biological substances status: Secondary | ICD-10-CM

## 2023-01-27 DIAGNOSIS — O9912 Other diseases of the blood and blood-forming organs and certain disorders involving the immune mechanism complicating childbirth: Secondary | ICD-10-CM | POA: Diagnosis present

## 2023-01-27 DIAGNOSIS — O34219 Maternal care for unspecified type scar from previous cesarean delivery: Secondary | ICD-10-CM

## 2023-01-27 DIAGNOSIS — O9902 Anemia complicating childbirth: Secondary | ICD-10-CM | POA: Diagnosis present

## 2023-01-27 DIAGNOSIS — Z98891 History of uterine scar from previous surgery: Principal | ICD-10-CM

## 2023-01-27 DIAGNOSIS — Z148 Genetic carrier of other disease: Secondary | ICD-10-CM | POA: Diagnosis not present

## 2023-01-27 DIAGNOSIS — Z302 Encounter for sterilization: Secondary | ICD-10-CM | POA: Diagnosis not present

## 2023-01-27 DIAGNOSIS — Z3A00 Weeks of gestation of pregnancy not specified: Secondary | ICD-10-CM | POA: Diagnosis not present

## 2023-01-27 DIAGNOSIS — Z3480 Encounter for supervision of other normal pregnancy, unspecified trimester: Secondary | ICD-10-CM

## 2023-01-27 DIAGNOSIS — O3463 Maternal care for abnormality of vagina, third trimester: Secondary | ICD-10-CM | POA: Diagnosis not present

## 2023-01-27 DIAGNOSIS — N90810 Female genital mutilation status, unspecified: Secondary | ICD-10-CM | POA: Diagnosis present

## 2023-01-27 DIAGNOSIS — O99119 Other diseases of the blood and blood-forming organs and certain disorders involving the immune mechanism complicating pregnancy, unspecified trimester: Secondary | ICD-10-CM | POA: Diagnosis present

## 2023-01-27 HISTORY — PX: INTRAUTERINE DEVICE (IUD) INSERTION: SHX5877

## 2023-01-27 LAB — CBC
HCT: 38.8 % (ref 36.0–46.0)
Hemoglobin: 12.5 g/dL (ref 12.0–15.0)
MCH: 28.5 pg (ref 26.0–34.0)
MCHC: 32.2 g/dL (ref 30.0–36.0)
MCV: 88.4 fL (ref 80.0–100.0)
Platelets: 87 10*3/uL — ABNORMAL LOW (ref 150–400)
RBC: 4.39 MIL/uL (ref 3.87–5.11)
RDW: 19.6 % — ABNORMAL HIGH (ref 11.5–15.5)
WBC: 4.9 10*3/uL (ref 4.0–10.5)
nRBC: 0 % (ref 0.0–0.2)

## 2023-01-27 LAB — CBC WITH DIFFERENTIAL/PLATELET
Abs Immature Granulocytes: 0.04 10*3/uL (ref 0.00–0.07)
Basophils Absolute: 0 10*3/uL (ref 0.0–0.1)
Basophils Relative: 0 %
Eosinophils Absolute: 0 10*3/uL (ref 0.0–0.5)
Eosinophils Relative: 0 %
HCT: 35 % — ABNORMAL LOW (ref 36.0–46.0)
Hemoglobin: 11.3 g/dL — ABNORMAL LOW (ref 12.0–15.0)
Immature Granulocytes: 1 %
Lymphocytes Relative: 16 %
Lymphs Abs: 1.4 10*3/uL (ref 0.7–4.0)
MCH: 28.3 pg (ref 26.0–34.0)
MCHC: 32.3 g/dL (ref 30.0–36.0)
MCV: 87.7 fL (ref 80.0–100.0)
Monocytes Absolute: 0.2 10*3/uL (ref 0.1–1.0)
Monocytes Relative: 3 %
Neutro Abs: 6.7 10*3/uL (ref 1.7–7.7)
Neutrophils Relative %: 80 %
Platelets: 92 10*3/uL — ABNORMAL LOW (ref 150–400)
RBC: 3.99 MIL/uL (ref 3.87–5.11)
RDW: 19.4 % — ABNORMAL HIGH (ref 11.5–15.5)
WBC: 8.4 10*3/uL (ref 4.0–10.5)
nRBC: 0 % (ref 0.0–0.2)

## 2023-01-27 SURGERY — Surgical Case
Anesthesia: Epidural | Laterality: Bilateral

## 2023-01-27 MED ORDER — DIPHENHYDRAMINE HCL 50 MG/ML IJ SOLN: 12.5000 mg | INTRAMUSCULAR | Status: DC | PRN

## 2023-01-27 MED ORDER — SIMETHICONE 80 MG PO CHEW: 80.0000 mg | CHEWABLE_TABLET | ORAL | Status: DC | PRN

## 2023-01-27 MED ORDER — DIPHENHYDRAMINE HCL 25 MG PO CAPS: 25.0000 mg | ORAL_CAPSULE | ORAL | Status: DC | PRN

## 2023-01-27 MED ORDER — MORPHINE SULFATE (PF) 0.5 MG/ML IJ SOLN
INTRAMUSCULAR | Status: DC | PRN
  Administered 2023-01-27: 150 ug via INTRATHECAL

## 2023-01-27 MED ORDER — SOD CITRATE-CITRIC ACID 500-334 MG/5ML PO SOLN
30.0000 mL | ORAL | Status: AC
  Administered 2023-01-27: 30 mL via ORAL

## 2023-01-27 MED ORDER — DEXAMETHASONE SODIUM PHOSPHATE 10 MG/ML IJ SOLN
INTRAMUSCULAR | Status: DC | PRN
  Administered 2023-01-27: 10 mg via INTRAVENOUS

## 2023-01-27 MED ORDER — SIMETHICONE 80 MG PO CHEW
80.0000 mg | CHEWABLE_TABLET | Freq: Three times a day (TID) | ORAL | Status: DC
  Administered 2023-01-27 – 2023-01-29 (×5): 80 mg via ORAL
  Filled 2023-01-27 (×5): qty 1

## 2023-01-27 MED ORDER — TRANEXAMIC ACID-NACL 1000-0.7 MG/100ML-% IV SOLN
INTRAVENOUS | Status: AC
  Filled 2023-01-27: qty 100

## 2023-01-27 MED ORDER — LEVONORGESTREL 20 MCG/DAY IU IUD
1.0000 | INTRAUTERINE_SYSTEM | Freq: Once | INTRAUTERINE | Status: AC
  Administered 2023-01-27: 1 via INTRAUTERINE

## 2023-01-27 MED ORDER — MEDROXYPROGESTERONE ACETATE 150 MG/ML IM SUSP: 150.0000 mg | INTRAMUSCULAR | Status: DC | PRN

## 2023-01-27 MED ORDER — GABAPENTIN 100 MG PO CAPS
100.0000 mg | ORAL_CAPSULE | Freq: Three times a day (TID) | ORAL | Status: DC
  Administered 2023-01-27 – 2023-01-29 (×6): 100 mg via ORAL
  Filled 2023-01-27 (×6): qty 1

## 2023-01-27 MED ORDER — OXYCODONE HCL 5 MG PO TABS: 5.0000 mg | ORAL_TABLET | ORAL | Status: DC | PRN

## 2023-01-27 MED ORDER — ENOXAPARIN SODIUM 40 MG/0.4ML IJ SOSY
40.0000 mg | PREFILLED_SYRINGE | INTRAMUSCULAR | Status: DC
  Filled 2023-01-27: qty 0.4

## 2023-01-27 MED ORDER — LACTATED RINGERS IV SOLN: INTRAVENOUS | Status: DC

## 2023-01-27 MED ORDER — ZOLPIDEM TARTRATE 5 MG PO TABS: 5.0000 mg | ORAL_TABLET | Freq: Every evening | ORAL | Status: DC | PRN

## 2023-01-27 MED ORDER — DIBUCAINE (PERIANAL) 1 % EX OINT: 1.0000 | TOPICAL_OINTMENT | CUTANEOUS | Status: DC | PRN

## 2023-01-27 MED ORDER — PRENATAL MULTIVITAMIN CH
1.0000 | ORAL_TABLET | Freq: Every day | ORAL | Status: DC
  Administered 2023-01-28 – 2023-01-29 (×2): 1 via ORAL
  Filled 2023-01-27 (×2): qty 1

## 2023-01-27 MED ORDER — METHYLERGONOVINE MALEATE 0.2 MG/ML IJ SOLN
0.2000 mg | Freq: Once | INTRAMUSCULAR | Status: AC
  Administered 2023-01-27: 0.2 mg via INTRAMUSCULAR

## 2023-01-27 MED ORDER — ACETAMINOPHEN 10 MG/ML IV SOLN
INTRAVENOUS | Status: DC | PRN
  Administered 2023-01-27: 1000 mg via INTRAVENOUS

## 2023-01-27 MED ORDER — ONDANSETRON HCL 4 MG/2ML IJ SOLN
INTRAMUSCULAR | Status: AC
  Filled 2023-01-27: qty 2

## 2023-01-27 MED ORDER — TRANEXAMIC ACID-NACL 1000-0.7 MG/100ML-% IV SOLN
1000.0000 mg | Freq: Once | INTRAVENOUS | Status: AC
Start: 2023-01-27 — End: 2023-01-27
  Administered 2023-01-27: 1000 mg via INTRAVENOUS

## 2023-01-27 MED ORDER — ACETAMINOPHEN 500 MG PO TABS
1000.0000 mg | ORAL_TABLET | Freq: Three times a day (TID) | ORAL | Status: DC
  Administered 2023-01-27 – 2023-01-29 (×6): 1000 mg via ORAL
  Filled 2023-01-27 (×6): qty 2

## 2023-01-27 MED ORDER — SOD CITRATE-CITRIC ACID 500-334 MG/5ML PO SOLN
ORAL | Status: AC
  Filled 2023-01-27: qty 30

## 2023-01-27 MED ORDER — NALOXONE HCL 0.4 MG/ML IJ SOLN: 0.4000 mg | INTRAMUSCULAR | Status: DC | PRN

## 2023-01-27 MED ORDER — STERILE WATER FOR IRRIGATION IR SOLN
Status: DC | PRN
  Administered 2023-01-27: 1

## 2023-01-27 MED ORDER — SODIUM CHLORIDE 0.9% FLUSH: 3.0000 mL | INTRAVENOUS | Status: DC | PRN

## 2023-01-27 MED ORDER — CEFAZOLIN SODIUM-DEXTROSE 2-4 GM/100ML-% IV SOLN
INTRAVENOUS | Status: AC
  Filled 2023-01-27: qty 100

## 2023-01-27 MED ORDER — SENNOSIDES-DOCUSATE SODIUM 8.6-50 MG PO TABS
2.0000 | ORAL_TABLET | Freq: Every day | ORAL | Status: DC
  Administered 2023-01-28 – 2023-01-29 (×2): 2 via ORAL
  Filled 2023-01-27 (×2): qty 2

## 2023-01-27 MED ORDER — OXYTOCIN-SODIUM CHLORIDE 30-0.9 UT/500ML-% IV SOLN
2.5000 [IU]/h | INTRAVENOUS | Status: AC
  Administered 2023-01-27: 2.5 [IU]/h via INTRAVENOUS

## 2023-01-27 MED ORDER — ONDANSETRON HCL 4 MG/2ML IJ SOLN: 4.0000 mg | Freq: Three times a day (TID) | INTRAMUSCULAR | Status: DC | PRN

## 2023-01-27 MED ORDER — DEXAMETHASONE SODIUM PHOSPHATE 10 MG/ML IJ SOLN
INTRAMUSCULAR | Status: AC
  Filled 2023-01-27: qty 1

## 2023-01-27 MED ORDER — BUPIVACAINE IN DEXTROSE 0.75-8.25 % IT SOLN
INTRATHECAL | Status: DC | PRN
  Administered 2023-01-27: 1.6 mL via INTRATHECAL

## 2023-01-27 MED ORDER — METHYLERGONOVINE MALEATE 0.2 MG/ML IJ SOLN
INTRAMUSCULAR | Status: AC
  Filled 2023-01-27: qty 1

## 2023-01-27 MED ORDER — MORPHINE SULFATE (PF) 0.5 MG/ML IJ SOLN
INTRAMUSCULAR | Status: AC
  Filled 2023-01-27: qty 10

## 2023-01-27 MED ORDER — WITCH HAZEL-GLYCERIN EX PADS: 1.0000 | MEDICATED_PAD | CUTANEOUS | Status: DC | PRN

## 2023-01-27 MED ORDER — DIPHENHYDRAMINE HCL 25 MG PO CAPS: 25.0000 mg | ORAL_CAPSULE | Freq: Four times a day (QID) | ORAL | Status: DC | PRN

## 2023-01-27 MED ORDER — BUPIVACAINE IN DEXTROSE 0.75-8.25 % IT SOLN
INTRATHECAL | Status: AC
  Filled 2023-01-27: qty 2

## 2023-01-27 MED ORDER — LEVONORGESTREL 20 MCG/DAY IU IUD
INTRAUTERINE_SYSTEM | INTRAUTERINE | Status: AC
  Filled 2023-01-27: qty 1

## 2023-01-27 MED ORDER — CEFAZOLIN SODIUM-DEXTROSE 2-4 GM/100ML-% IV SOLN
2.0000 g | INTRAVENOUS | Status: AC
  Administered 2023-01-27: 2 g via INTRAVENOUS

## 2023-01-27 MED ORDER — NALOXONE HCL 4 MG/10ML IJ SOLN: 1.0000 ug/kg/h | INTRAVENOUS | Status: DC | PRN

## 2023-01-27 MED ORDER — SCOPOLAMINE 1 MG/3DAYS TD PT72
1.0000 | MEDICATED_PATCH | Freq: Once | TRANSDERMAL | Status: DC
  Administered 2023-01-27: 1.5 mg via TRANSDERMAL
  Filled 2023-01-27: qty 1

## 2023-01-27 MED ORDER — IBUPROFEN 800 MG PO TABS
800.0000 mg | ORAL_TABLET | Freq: Three times a day (TID) | ORAL | Status: DC
  Administered 2023-01-28 – 2023-01-29 (×3): 800 mg via ORAL
  Filled 2023-01-27 (×3): qty 1

## 2023-01-27 MED ORDER — COCONUT OIL OIL: 1.0000 | TOPICAL_OIL | Status: DC | PRN

## 2023-01-27 MED ORDER — MEASLES, MUMPS & RUBELLA VAC IJ SOLR
0.5000 mL | Freq: Once | INTRAMUSCULAR | Status: DC
  Filled 2023-01-27: qty 0.5

## 2023-01-27 MED ORDER — FENTANYL CITRATE (PF) 100 MCG/2ML IJ SOLN
INTRAMUSCULAR | Status: DC | PRN
  Administered 2023-01-27: 15 ug via INTRATHECAL

## 2023-01-27 MED ORDER — OXYTOCIN-SODIUM CHLORIDE 30-0.9 UT/500ML-% IV SOLN
INTRAVENOUS | Status: AC
  Filled 2023-01-27: qty 500

## 2023-01-27 MED ORDER — MENTHOL 3 MG MT LOZG: 1.0000 | LOZENGE | OROMUCOSAL | Status: DC | PRN

## 2023-01-27 MED ORDER — PHENYLEPHRINE HCL-NACL 20-0.9 MG/250ML-% IV SOLN
INTRAVENOUS | Status: AC
  Filled 2023-01-27: qty 250

## 2023-01-27 MED ORDER — ACETAMINOPHEN 10 MG/ML IV SOLN
INTRAVENOUS | Status: AC
  Filled 2023-01-27: qty 100

## 2023-01-27 MED ORDER — OXYTOCIN-SODIUM CHLORIDE 30-0.9 UT/500ML-% IV SOLN
INTRAVENOUS | Status: DC | PRN
  Administered 2023-01-27: 300 mL via INTRAVENOUS

## 2023-01-27 MED ORDER — ACETAMINOPHEN 500 MG PO TABS: 1000.0000 mg | ORAL_TABLET | Freq: Four times a day (QID) | ORAL | Status: DC

## 2023-01-27 MED ORDER — TRANEXAMIC ACID-NACL 1000-0.7 MG/100ML-% IV SOLN
INTRAVENOUS | Status: AC
Start: 2023-01-27 — End: ?
  Filled 2023-01-27: qty 100

## 2023-01-27 MED ORDER — TRANEXAMIC ACID-NACL 1000-0.7 MG/100ML-% IV SOLN
1000.0000 mg | INTRAVENOUS | Status: AC
  Administered 2023-01-27: 1000 mg via INTRAVENOUS

## 2023-01-27 MED ORDER — KETOROLAC TROMETHAMINE 30 MG/ML IJ SOLN
30.0000 mg | Freq: Four times a day (QID) | INTRAMUSCULAR | Status: AC
Start: 2023-01-27 — End: 2023-01-28
  Administered 2023-01-27 – 2023-01-28 (×4): 30 mg via INTRAVENOUS
  Filled 2023-01-27 (×4): qty 1

## 2023-01-27 MED ORDER — PHENYLEPHRINE HCL-NACL 20-0.9 MG/250ML-% IV SOLN
INTRAVENOUS | Status: DC | PRN
  Administered 2023-01-27: 60 ug/min via INTRAVENOUS

## 2023-01-27 MED ORDER — ONDANSETRON HCL 4 MG/2ML IJ SOLN
INTRAMUSCULAR | Status: DC | PRN
  Administered 2023-01-27: 4 mg via INTRAVENOUS

## 2023-01-27 MED ORDER — FENTANYL CITRATE (PF) 100 MCG/2ML IJ SOLN
INTRAMUSCULAR | Status: AC
  Filled 2023-01-27: qty 2

## 2023-01-27 SURGICAL SUPPLY — 28 items
CANISTER SUCT 3000ML PPV (MISCELLANEOUS) ×1 IMPLANT
CHLORAPREP W/TINT 26ML (MISCELLANEOUS) ×2 IMPLANT
CLAMP UMBILICAL CORD (MISCELLANEOUS) ×1 IMPLANT
CLIP FILSHIE TUBAL LIGA STRL (Clip) ×1 IMPLANT
CLOTH BEACON ORANGE TIMEOUT ST (SAFETY) ×1 IMPLANT
DERMABOND ADVANCED .7 DNX12 (GAUZE/BANDAGES/DRESSINGS) ×1 IMPLANT
DRAPE SHEET LG 3/4 BI-LAMINATE (DRAPES) ×1 IMPLANT
DRSG OPSITE POSTOP 4X10 (GAUZE/BANDAGES/DRESSINGS) ×1 IMPLANT
ELECT REM PT RETURN 9FT ADLT (ELECTROSURGICAL) ×1
ELECTRODE REM PT RTRN 9FT ADLT (ELECTROSURGICAL) ×1 IMPLANT
EXTRACTOR VACUUM KIWI (MISCELLANEOUS) ×1 IMPLANT
GLOVE BIOGEL PI IND STRL 7.0 (GLOVE) ×3 IMPLANT
GLOVE ECLIPSE 6.5 STRL STRAW (GLOVE) ×1 IMPLANT
GOWN STRL REUS W/ TWL LRG LVL3 (GOWN DISPOSABLE) ×2 IMPLANT
Mirena IMPLANT
NS IRRIG 1000ML POUR BTL (IV SOLUTION) ×1 IMPLANT
PAD OB MATERNITY 4.3X12.25 (PERSONAL CARE ITEMS) ×1 IMPLANT
PAD PREP 24X48 CUFFED NSTRL (MISCELLANEOUS) ×1 IMPLANT
RETRACTOR WND ALEXIS 25 LRG (MISCELLANEOUS) IMPLANT
RTRCTR WOUND ALEXIS 25CM LRG (MISCELLANEOUS)
SUT MNCRL AB 0 CT1 27 (SUTURE) ×1 IMPLANT
SUT PLAIN 2 0 XLH (SUTURE) ×1 IMPLANT
SUT VIC AB 0 CT1 36 (SUTURE) ×2 IMPLANT
SUT VIC AB 2-0 CT1 TAPERPNT 27 (SUTURE) ×1 IMPLANT
SUT VIC AB 4-0 KS 27 (SUTURE) ×1 IMPLANT
TOWEL OR 17X24 6PK STRL BLUE (TOWEL DISPOSABLE) ×3 IMPLANT
TRAY FOLEY CATH SILVER 16FR (SET/KITS/TRAYS/PACK) ×1 IMPLANT
WATER STERILE IRR 1000ML POUR (IV SOLUTION) ×1 IMPLANT

## 2023-01-27 NOTE — Progress Notes (Signed)
This RN called OB Anesthesiologist, Dr. Mal Amabile, about removal of epidural catheter with platelets being at 92. OB Anesthesiologist ordered to wait for new platelet result with CBC at 0500 on 1/17.

## 2023-01-27 NOTE — Op Note (Addendum)
Cesarean Section Operative Note   Patient: Dorothy Haas  Date of Procedure: 01/27/2023  Procedure: Repeat Low Transverse Cesarean, Mirena IUD insertion  Indications: previous uterine incision: low transverse with previous window  Pre-operative Diagnosis: RCSx4 IUD placement  Post-operative Diagnosis: Same and IUD insertion: Mirena  TOLAC Candidate: No  Surgeon: Surgeons and Role:    * Federico Flake, MD - Primary    * Reva Bores, MD - Assisting    * Joanne Gavel, MD - Fellow  Assistants: An experienced assistant was required given the standard of surgical care given the complexity of the case.  This assistant was needed for exposure, dissection, suctioning, retraction, instrument exchange, assisting with delivery with administration of fundal pressure, and for overall help during the procedure.   Anesthesia: spinal  Anesthesiologist: [place] Nation, MD   Antibiotics: Cefazolin   Estimated Blood Loss: 244 ml   Total IV Fluids: 2000 ml  Urine Output:  200 cc OF clear urine  Specimens: None  Complications:  extremely large uterine window (~16 cm x16 cm) broad ligament to broad ligament    Indications: Dorothy Haas is a 33 y.o. [phone] with an IUP [redacted]w[redacted]d presenting for scheduled cesarean secondary to the indications listed above. Clinical course notable for none.  The risks of cesarean section discussed with the patient included but were not limited to: bleeding which may require transfusion or reoperation; infection which may require antibiotics; injury to bowel, bladder, ureters or other surrounding organs; injury to the fetus; need for additional procedures including hysterectomy in the event of a life-threatening hemorrhage; placental abnormalities with subsequent pregnancies, incisional problems, thromboembolic phenomenon and other postoperative/anesthesia complications. The patient concurred with the proposed plan, giving informed  written consent for the procedure. Patient has been NPO since last night she will remain NPO for procedure. Anesthesia and OR aware. Preoperative prophylactic antibiotics and SCDs ordered on call to the OR.   Findings: Viable infant in cephalic presentation, no nuchal cord present. Apgars 9, 9, . Weight 3150 g. Clear amniotic fluid. Normal placenta, three vessel cord.  Uterine window present- Uterine window was ~16cm by 16cm and extended the entire lower uterine segment from broad to board. Additionally the bladder was close to the uterine window.    Normal bilateral fallopian tubes, Normal bilateral ovaries. Minimal adhesive disease.  Procedure Details: A Time Out was held and the above information confirmed. The patient received intravenous antibiotics and had sequential compression devices applied to her lower extremities preoperatively. The patient was taken back to the operative suite where spinal anesthesia was administered. After induction of anesthesia, the patient was draped and prepped in the usual sterile manner and placed in a dorsal supine position with a leftward tilt. A low transverse skin incision was made over prior incision with scalpel and carried down through the subcutaneous tissue to the fascia. Fascial incision was made and extended transversely. Extremely thin rectus muscles and peritoneum which separated with blunt dissection of fascia. An Alexis retractor was placed to aid in visualization of the uterus. A bladder flap was not developed. 16 cm x 16 cm uterine window noted which encompassed the entirety of lower uterine segment, broad ligament to broad ligament. A low transverse uterine incision was made. The infant was successfully delivered from cephalic presentation, the umbilical cord was clamped after 1 minute. Cord ph was not sent, and cord blood was obtained for evaluation. The placenta was removed Intact and appeared normal.   A Mirena IUD  was then removed from it's packaging  in a sterile manner and the strings were trimmed to approximately 10 cm. The IUD was placed manually at the uterine fundus and the strings were passed through the cervical os with a Kelly clamp.   The uterine incision was closed with a single layer running unlocked suture of 0-Monocryl. Overall, excellent hemostasis was noted. The abdomen and the pelvis were cleared of all clot and debris and the Jon Gills was removed. Hemostasis was confirmed on all surfaces.  The peritoneum was reapproximated using 2-0 vicryl . The fascia was then closed using 0 Vicryl in a running fashion. The subcutaneous layer was not reapproximated. The skin was closed with a 4-0 vicryl subcuticular stitch. The patient tolerated the procedure well. Sponge, lap, instrument and needle counts were correct x 2. She was taken to the recovery room in stable condition.  Disposition: PACU - hemodynamically stable.    Signed: Joanne Gavel, MD OB Fellow, Faculty [facility] for [facility]   Attestation of Attending Supervision of [place] Fellow: Evaluation and management procedures were performed by the Family Medicine OB Fellow under my supervision.  I have reviewed the Fellow's note and chart. I was gloved and gowned for the entirety of the procedure and involved during the case. I have made any necessary editorial changes.    Federico Flake, MD, MPH, ABFM Attending Physician Center for [facility]

## 2023-01-27 NOTE — Plan of Care (Signed)

## 2023-01-27 NOTE — Transfer of Care (Signed)
Immediate Anesthesia Transfer of Care Note  Patient: Dorothy Haas  Procedure(s) Performed: CESAREAN SECTION WITH BILATERAL TUBAL LIGATION (Bilateral) INTRAUTERINE DEVICE (IUD) INSERTION (Bilateral)  Patient Location: PACU  Anesthesia Type:Spinal/Epidural  Level of Consciousness: awake  Airway & Oxygen Therapy: Patient Spontanous Breathing  Post-op Assessment: Report given to RN and Post -op Vital signs reviewed and stable  Post vital signs: Reviewed and stable  Last Vitals:  Vitals Value Taken Time  BP    Temp    Pulse    Resp    SpO2      Last Pain:  Vitals:   01/27/23 0836  TempSrc: Oral  PainSc:          Complications: No notable events documented.

## 2023-01-27 NOTE — Anesthesia Procedure Notes (Addendum)
Spinal  Patient location during procedure: OR Start time: 01/27/2023 10:50 AM End time: 01/27/2023 11:00 AM Reason for block: surgical anesthesia Staffing Performed: anesthesiologist  Anesthesiologist: [place] Nation, MD Performed by: [place] Nation, MD Authorized by: [place] Nation, MD   Preanesthetic Checklist Completed: patient identified, IV checked, site marked, risks and benefits discussed, surgical consent, monitors and equipment checked, pre-op evaluation and timeout performed Spinal Block Patient position: sitting Prep: DuraPrep Patient monitoring: heart rate, cardiac monitor, continuous pulse ox and blood pressure Approach: midline Location: L4-5 Injection technique: single-shot (CSE) Needle Needle type: Pencan and Tuohy  Needle gauge: 24 G Catheter type: closed end flexible Catheter size: 20 g Catheter at skin depth: 10 cm Assessment Sensory level: T6 Additional Notes Functioning IV was confirmed and monitors were applied. Sterile prep and drape, including hand hygiene and sterile gloves were used. The patient was positioned and the spine was prepped. The skin was anesthetized with lidocaine.  Free flow of clear CSF was obtained prior to injecting local anesthetic into the CSF.  The spinal needle aspirated freely following injection.  The needle was carefully withdrawn.  The patient tolerated the procedure well.    Combined spinal epidural performed. LOR with air at 5cm. Spinal performed through tuohy needle. Catheter then threaded to 10cm at the skin.

## 2023-01-27 NOTE — Lactation Note (Signed)
This note was copied from a baby's chart. Lactation Consultation Note  Patient Name: Dorothy Haas [id number] Date: 01/27/2023 Age:32 hours Reason for consult: Initial assessment;Early term 37-38.6wks (maternal blood loss of 1652) Video interpreter Evonne [phone]) used to communicate in Arabic.  P4- MOB reports that feedings have been going well. MOB has breast fed all of her older children for a year and 8 months each. MOB requested to be completed for lactation services because she has experience. MOB does not have a pump for at home, but states that her insurance told her to get one from a website. MOB could not remember what the website was called. LC provided MOB with clear instructions on how to order from [employer]. LC reviewed feeding infant on cue 8-12x in 24 hrs, not allowing infant to go over 3 hrs without a feeding, CDC milk storage guidelines and LC services handout. LC encouraged MOB to reach out for further assistance as needed.  Maternal Data Does the patient have breastfeeding experience prior to this delivery?: Yes How long did the patient breastfeed?: 1year and 8 months for all three of her older children  Feeding Mother's Current Feeding Choice: Breast Milk and Formula Nipple Type: Slow - flow  Lactation Tools Discussed/Used Pump Education: Milk Storage  Interventions Interventions: Breast feeding basics reviewed;Education;LC Services brochure  Discharge Discharge Education: Engorgement and breast care;Warning signs for feeding baby Pump: Advised to call insurance company  Consult Status Consult Status: Complete (mother declined follow up) Date: 01/27/23    Dema Severin BS, IBCLC 01/27/2023, 7:24 PM

## 2023-01-27 NOTE — Progress Notes (Signed)
Platelets: 92 Per anesthesiologist, ok to pull epidural Per OB, ok to give Toradol

## 2023-01-27 NOTE — Anesthesia Procedure Notes (Deleted)
Epidural Patient location during procedure: OR Start time: 01/27/2023 10:50 AM End time: 01/27/2023 11:00 AM  Staffing Anesthesiologist: [place] Nation, MD Performed: anesthesiologist   Preanesthetic Checklist Completed: patient identified, IV checked, risks and benefits discussed, monitors and equipment checked, pre-op evaluation and timeout performed  Epidural Patient position: sitting Prep: DuraPrep Patient monitoring: heart rate, cardiac monitor, continuous pulse ox and blood pressure Approach: midline Location: L3-L4 Injection technique: LOR air  Needle:  Needle type: Tuohy  Needle gauge: 17 G Needle length: 9 cm Catheter type: closed end flexible Catheter size: 19 Gauge Test dose: negative  Assessment Sensory level: T8  Additional Notes Patient identified. Risks/Benefits/Options discussed with patient including but not limited to bleeding, infection, nerve damage, paralysis, failed block, incomplete pain control, headache, blood pressure changes, nausea, vomiting, reactions to medication both or allergic, itching and postpartum back pain. Confirmed with bedside nurse the patient's most recent platelet count. Confirmed with patient that they are not currently taking any anticoagulation, have any bleeding history or any family history of bleeding disorders. Patient expressed understanding and wished to proceed. All questions were answered. Sterile technique was used throughout the entire procedure. Please see nursing notes for vital signs. Test dose was given through epidural catheter and negative prior to continuing to dose epidural or start infusion. Warning signs of high block given to the patient including shortness of breath, tingling/numbness in hands, complete motor block, or any concerning symptoms with instructions to call for help. Patient was given instructions on fall risk and not to get out of bed. All questions and concerns addressed with instructions to call with any  issues or inadequate analgesia.  Reason for block:procedure for pain

## 2023-01-27 NOTE — Discharge Summary (Signed)
Postpartum Discharge Summary  Date of Service updated     Patient Name: Dorothy Haas DOB: 08-Oct-1990 MRN: [id number]  Date of admission: 01/27/2023 Delivery date:01/27/2023 Delivering provider: Federico Flake Date of discharge: 01/29/2023  Admitting diagnosis: S/P repeat low transverse C-section [Z98.891] Intrauterine pregnancy: [redacted]w[redacted]d     Secondary diagnosis:  Principal Problem:   Status post repeat low transverse cesarean section Active Problems:   History of cesarean delivery affecting pregnancy (G3 with uterine window)   Female circumcision   Uterine scar from previous surgery affecting pregnancy   Gestational thrombocytopenia (HCC)   Supervision of other normal pregnancy, antepartum  Additional problems: none    Discharge diagnosis: Term Pregnancy Delivered and large uterine window                                               Post partum procedures: none Augmentation: N/A Complications: 16 cm x 16 cm uterine window encompassing entirety of lower uterine segment  Hospital course: Sceduled C/S   33 y.o. yo [id number] at [redacted]w[redacted]d was admitted to the hospital 01/27/2023 for scheduled cesarean section with the following indication: prior uterine window .Delivery details are as follows:  Membrane Rupture Time/Date: 11:17 AM,01/27/2023  Delivery Method:C-Section, Low Transverse Operative Delivery:N/A Details of operation can be found in separate operative note. IUD placed at time of C-section. Patient had a postpartum course that was uncomplicated.  She is ambulating, tolerating a regular diet, passing flatus, and urinating well. Patient is discharged home in stable condition on  01/29/23        Newborn Data: Birth date:01/27/2023 Birth time:11:18 AM Gender:Female Living status:Living Apgars:9 ,9  Weight:3150 g    Magnesium Sulfate received: No BMZ received: No Rhophylac:No MMR:No T-DaP:Given prenatally Flu: No RSV Vaccine received:  No Transfusion:No  Immunizations received: Immunization History  Administered Date(s) Administered   Influenza,inj,Quad PF,6+ Mos 12/06/2017   PPD Test 01/26/2022   Tdap 03/15/2018, 11/25/2022    Physical exam  Vitals:   01/28/23 0545 01/28/23 1257 01/28/23 2114 01/29/23 0500  BP: 98/68 101/60 (!) 89/52 97/68  Pulse: (!) 56 71 72 79  Resp: 16 17 17 16   Temp: 97.8 F (36.6 C) 97.6 F (36.4 C) 98.1 F (36.7 C) 98.1 F (36.7 C)  TempSrc: Oral Oral Oral Oral  SpO2: 99% 100% 100% 100%  Weight:      Height:       General: alert, cooperative, and no distress Lochia: appropriate Uterine Fundus: firm Incision: Dressing is clean, dry, and intact DVT Evaluation: No evidence of DVT seen on physical exam. Labs: Lab Results  Component Value Date   WBC 9.7 01/28/2023   HGB 8.9 (L) 01/28/2023   HCT 27.4 (L) 01/28/2023   MCV 87.5 01/28/2023   PLT 91 (L) 01/28/2023      Latest Ref Rng & Units 05/01/2018    2:34 PM  CMP  Creatinine 0.44 - 1.00 mg/dL 1.47    Edinburgh Score:    01/27/2023    3:00 PM  Edinburgh Postnatal Depression Scale Screening Tool  I have been able to laugh and see the funny side of things. 0  I have looked forward with enjoyment to things. 0  I have blamed myself unnecessarily when things went wrong. 0  I have been anxious or worried for no good reason. 0  I have felt scared  or panicky for no good reason. 0  Things have been getting on top of me. 0  I have been so unhappy that I have had difficulty sleeping. 0  I have felt sad or miserable. 0  I have been so unhappy that I have been crying. 0  The thought of harming myself has occurred to me. 0  Edinburgh Postnatal Depression Scale Total 0   No data recorded  After visit meds:  Allergies as of 01/29/2023       Reactions   Egg-derived Products Nausea And Vomiting   Promethazine    Hallucination        Medication List     STOP taking these medications    butalbital-acetaminophen-caffeine  50-325-40 MG tablet Commonly known as: FIORICET   famotidine 20 MG tablet Commonly known as: PEPCID   ondansetron 4 MG disintegrating tablet Commonly known as: ZOFRAN-ODT       TAKE these medications    acetaminophen 325 MG tablet Commonly known as: Tylenol Take 2 tablets (650 mg total) by mouth every 6 (six) hours as needed.   docusate sodium 100 MG capsule Commonly known as: Colace Take 1 capsule (100 mg total) by mouth 2 (two) times daily for 20 days.   ferrous sulfate 325 (65 FE) MG tablet Take 1 tablet (325 mg total) by mouth every other day. Start taking on: January 30, 2023   ibuprofen 800 MG tablet Commonly known as: ADVIL Take 1 tablet (800 mg total) by mouth every 8 (eight) hours.   oxyCODONE 5 MG immediate release tablet Commonly known as: Oxy IR/ROXICODONE Take 1 tablet (5 mg total) by mouth every 6 (six) hours as needed for up to 7 days for moderate pain (pain score 4-6).         Discharge home in stable condition Infant Feeding: Breast Infant Disposition:home with mother Discharge instruction: per After Visit Summary and Postpartum booklet. Activity: Advance as tolerated. Pelvic rest for 6 weeks. IUD placed during C-section Diet: routine diet Future Appointments: Future Appointments  Date Time Provider Department Center  02/03/2023  9:50 AM Milas Hock, MD [facility]  03/10/2023  9:50 AM Lennart Pall, MD [facility]   Follow up Visit:  PP visits already scheduled prior to admission   01/29/2023 Sharon Seller, DO

## 2023-01-27 NOTE — Progress Notes (Signed)
Interpreter stayed through first assessment post-op ,  patient had no additional questions at this time

## 2023-01-27 NOTE — Anesthesia Preprocedure Evaluation (Addendum)
Anesthesia Evaluation  Patient identified by MRN, date of birth, ID band Patient awake    Reviewed: Allergy & Precautions, NPO status , Patient's Chart, lab work & pertinent test results  History of Anesthesia Complications Negative for: history of anesthetic complications  Airway Mallampati: I  TM Distance: >3 FB Neck ROM: Full    Dental no notable dental hx. (+) Dental Advisory Given   Pulmonary neg pulmonary ROS   Pulmonary exam normal breath sounds clear to auscultation       Cardiovascular negative cardio ROS Normal cardiovascular exam Rhythm:Regular Rate:Normal     Neuro/Psych negative neurological ROS     GI/Hepatic Neg liver ROS,GERD  ,,  Endo/Other  negative endocrine ROS    Renal/GU negative Renal ROS     Musculoskeletal   Abdominal   Peds  Hematology  (+) Blood dyscrasia, anemia   Anesthesia Other Findings Plt 87  Reproductive/Obstetrics (+) Pregnancy                              Anesthesia Physical Anesthesia Plan  ASA: 2  Anesthesia Plan: Spinal and Epidural   Post-op Pain Management:    Induction:   PONV Risk Score and Plan: Treatment may vary due to age or medical condition  Airway Management Planned: Natural Airway  Additional Equipment:   Intra-op Plan:   Post-operative Plan:   Informed Consent: I have reviewed the patients History and Physical, chart, labs and discussed the procedure including the risks, benefits and alternatives for the proposed anesthesia with the patient or authorized representative who has indicated his/her understanding and acceptance.     Dental advisory given  Plan Discussed with: CRNA  Anesthesia Plan Comments:        Anesthesia Quick Evaluation

## 2023-01-27 NOTE — H&P (Signed)
Obstetric Preoperative History and Physical  Dorothy Haas is a 33 y.o. [phone] with IUP at [redacted]w[redacted]d presenting for presenting for scheduled cesarean section.  No acute concerns.   Prenatal Course Source of Care: [facility]  with onset of care at 12 weeks Pregnancy complications or risks: Patient Active Problem List   Diagnosis Date Noted   S/P repeat low transverse C-section 01/27/2023   Anemia 11/26/2022   Alpha thalassemia silent carrier 09/25/2022   GBS bacteriuria 07/20/2022   Language barrier 07/14/2022   Supervision of other normal pregnancy, antepartum 07/08/2022   Uterine scar from previous surgery affecting pregnancy 05/01/2018   Gestational thrombocytopenia (HCC) 05/01/2018   Hyperemesis gravidarum 10/12/2017   History of cesarean delivery affecting pregnancy (G3 with uterine window) 10/10/2017   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   Prenatal labs and studies: ABO, Rh: --/--/O POS (01/14 2536) Antibody: NEG (01/14 0931) Rubella: 9.68 (07/03 1446) RPR: NON REACTIVE (01/14 0930)  HBsAg: Negative (07/03 1446)  HIV: NON REACTIVE (01/14 0930)  GBS: collected and pending 2 hr Glucola  WNL Genetic screening normal Anatomy US normal  Prenatal Transfer Tool  Maternal Diabetes: No Genetic Screening: Normal Maternal Ultrasounds/Referrals: Normal Fetal Ultrasounds or other Referrals:  None Maternal Substance Abuse:  No Significant Maternal Medications:  None Significant Maternal Lab Results: Other: GBS pending  Immunization History  Administered Date(s) Administered   Influenza,inj,Quad PF,6+ Mos 12/06/2017   Tdap 03/15/2018, 11/25/2022    Past Medical History:  Diagnosis Date   Abnormal uterine bleeding (AUB)    Anemia    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   Gestational thrombocytopenia (HCC)    History of gastroesophageal reflux (GERD) 02/02/2018   during pregnancy    History of hyperthyroidism 2019   during pregnancy--- resolved   History of thrombocytopenia 05/01/2018   gestational   Medical history non-contributory     Past Surgical History:  Procedure Laterality Date   CESAREAN SECTION     2011 and 2015   CESAREAN SECTION N/A 05/01/2018   Procedure: CESAREAN SECTION;  Surgeon: Adam Phenix, MD;  Location: [facility];  Service: Obstetrics;  Laterality: N/A;   HYSTEROSCOPY WITH D & C N/A 03/31/2022   Procedure: DILATATION AND CURETTAGE /HYSTEROSCOPY;  Surgeon: Lennart Pall, MD;  Location: [facility] [employer];  Service: Gynecology;  Laterality: N/A;    OB History  Gravida Para Term Preterm AB Living  4 3 3   3   SAB IAB Ectopic Multiple Live Births     0 3    # Outcome Date GA Lbr Len/2nd Weight Sex Type Anes PTL Lv  4 Current           3 Term 05/01/18 [redacted]w[redacted]d  3445 g F CS-LTranv Spinal  LIV  2 Term 01/20/13    M CS-Unspec Spinal  LIV  1 Term 05/10/09    F CS-Unspec Spinal  LIV     Complications: Cephalopelvic Disproportion    Social History   Socioeconomic History   Marital status: Married    Spouse name: Not on file   Number of children: Not on file   Years of education: Not on file   Highest education level: Not on file  Occupational History   Not on file  Tobacco Use   Smoking status: Never   Smokeless tobacco: Never  Vaping Use   Vaping status: Never Used  Substance and Sexual Activity   Alcohol use: Never  Drug use: Never   Sexual activity: Yes    Birth control/protection: None  Other Topics Concern   Not on file  Social History Narrative   Not on file   Social Drivers of Health   Financial Resource Strain: Not on file  Food Insecurity: No Food Insecurity (12/26/2022)   Received from [facility]   Hunger Vital Sign    Worried About Running Out of Food in the Last Year: Never true    Ran Out of Food in the Last Year: Never true  Transportation Needs: No Transportation Needs (12/26/2022)    Received from [facility] - Transportation    Lack of Transportation (Medical): No    Lack of Transportation (Non-Medical): No  Physical Activity: Not on file  Stress: No Stress Concern Present (07/17/2022)   Received from [facility], [facility] - Occupational Stress Questionnaire    Feeling of Stress : Only a little  Social Connections: Unknown (05/26/2021)   Received from [facility], [facility]   Social Network    Social Network: Not on file    Family History  Problem Relation Age of Onset   Diabetes Paternal Grandfather    Asthma Neg Hx    Cancer Neg Hx    Heart disease Neg Hx    Hypertension Neg Hx     Medications Prior to Admission  Medication Sig Dispense Refill Last Dose/Taking   butalbital-acetaminophen-caffeine (FIORICET) 50-325-40 MG tablet Take 2 tablets by mouth every 6 (six) hours as needed for headache. 10 tablet 0 01/26/2023   famotidine (PEPCID) 20 MG tablet Take 20 mg by mouth 2 (two) times daily.   Past Month   ondansetron (ZOFRAN-ODT) 4 MG disintegrating tablet Take 1 tablet (4 mg total) by mouth every 8 (eight) hours as needed for nausea or vomiting. 20 tablet 0 Past Month    Allergies  Allergen Reactions   Egg-Derived Products Nausea And Vomiting   Promethazine     Hallucination    Review of Systems: Negative except for what is mentioned in HPI.  Physical Exam: BP (!) 123/90   Pulse 76   Temp 98 F (36.7 C) (Oral)   Resp 17   Wt 53.5 kg   LMP 04/28/2022   SpO2 100%   BMI 21.58 kg/m  FHR by Doppler: 147 bpm CONSTITUTIONAL: Well-developed, well-nourished female in no acute distress.  HENT:  Normocephalic, atraumatic, External right and left ear normal. Oropharynx is clear and moist EYES: Conjunctivae and EOM are normal. Pupils are equal, round, and reactive to light. No scleral icterus.  NECK: Normal range of motion, supple, no masses SKIN: Skin is warm and dry. No rash noted. Not  diaphoretic. No erythema. No pallor. NEUROLGIC: Alert and oriented to person, place, and time. Normal reflexes, muscle tone coordination. No cranial nerve deficit noted. PSYCHIATRIC: Normal mood and affect. Normal behavior. Normal judgment and thought content. CARDIOVASCULAR: Normal heart rate noted, regular rhythm RESPIRATORY: Effort and breath sounds normal, no problems with respiration noted ABDOMEN: Soft, nontender, nondistended, gravid. Well-healed Pfannenstiel incision. PELVIC: Deferred MUSCULOSKELETAL: Normal range of motion. No edema and no tenderness. 2+ distal pulses.   Pertinent Labs/Studies:   Results for orders placed or performed during the hospital encounter of 01/25/23 (from the past 72 hours)  CBC     Status: Abnormal   Collection Time: 01/25/23  9:30 AM  Result Value Ref Range   WBC 4.1 4.0 - 10.5 K/uL   RBC  4.40 3.87 - 5.11 MIL/uL   Hemoglobin 12.3 12.0 - 15.0 g/dL   HCT 16.1 09.6 - 04.5 %   MCV 90.2 80.0 - 100.0 fL   MCH 28.0 26.0 - 34.0 pg   MCHC 31.0 30.0 - 36.0 g/dL   RDW 40.9 (H) 81.1 - 91.4 %   Platelets 93 (L) 150 - 400 K/uL    Comment: Immature Platelet Fraction may be clinically indicated, consider ordering this additional test [id number] REPEATED TO VERIFY    nRBC 0.0 0.0 - 0.2 %    Comment: Performed at [facility], [address] [place], [place] [address]  RPR     Status: None   Collection Time: 01/25/23  9:30 AM  Result Value Ref Range   RPR Ser Ql NON REACTIVE NON REACTIVE    Comment: Performed at [facility], [address], [place], [place] [address]  Rapid HIV screen (HIV 1/2 Ab+Ag)     Status: None   Collection Time: 01/25/23  9:30 AM  Result Value Ref Range   HIV-1 P24 Antigen - HIV24 NON REACTIVE NON REACTIVE    Comment: (NOTE) Detection of p24 may be inhibited by biotin in the sample, causing false negative results in acute infection.    HIV 1/2 Antibodies NON REACTIVE NON REACTIVE   Interpretation (HIV Ag Ab)      A  non reactive test result means that HIV 1 or HIV 2 antibodies and HIV 1 p24 antigen were not detected in the specimen.    Comment: Performed at [facility], [address], [place], [place] [address]  Type and screen     Status: None   Collection Time: 01/25/23  9:31 AM  Result Value Ref Range   ABO/RH(D) O POS    Antibody Screen NEG    Sample Expiration      01/28/2023,2359 Performed at [facility], [address] [place], [place] [address]     Assessment and Plan :Malijah Rubert is a 33 y.o. [phone] at [redacted]w[redacted]d being admitted being admitted for scheduled cesarean section. The risks of cesarean section discussed with the patient included but were not limited to: bleeding which may require transfusion or reoperation; infection which may require antibiotics; injury to bowel, bladder, ureters or other surrounding organs; injury to the fetus; need for additional procedures including hysterectomy in the event of a life-threatening hemorrhage; placental abnormalities wth subsequent pregnancies, incisional problems, thromboembolic phenomenon and other postoperative/anesthesia complications. The patient concurred with the proposed plan, giving informed written consent for the procedure. Patient has been NPO since last night she will remain NPO for procedure. Anesthesia and OR aware. Preoperative prophylactic antibiotics and SCDs ordered on call to the OR. To OR when ready.   Undesired fertility,status post vaginal delivery, expressed desire for permanent sterilization.  I reviewed with the patient her expressed plan for tubal sterilization.  We dicussed other reversible forms of contraception including LARC options with similar effectiveness of BTS. She expressed that she strongly desire permanent sterilization. She declines all other modalities. We discussed method of sterilization with the preferred method being salpingectomy. Risks of procedure discussed with patient including but  not limited to: risk of regret, permanence of method, bleeding, infection, injury to surrounding organs and need for additional procedures.  Failure risk of 0.5-1% with increased risk of ectopic gestation if pregnancy occurs was also discussed with patient.    After I left the patient and had signed consent for  RLTCS with BTS the husband and patient called me back. They wanted to discussed LARC again specifically IUD insertion post-placentally. We discussed the options for IUD types, risk of expulsion, efficacy, and placement. Reviewed general risks of IUD as well. Patient confirmed through direct question by myself using iPad interpretation that she desired LNG IUD placement and wished to change her consent to reflect IUD insertion and NOT tubal ligation. This change was made. RN Tour manager witness consent discussion  Federico Flake, MD, MPH, ABFM Attending Physician Center for [facility]

## 2023-01-28 LAB — CBC
HCT: 27.4 % — ABNORMAL LOW (ref 36.0–46.0)
Hemoglobin: 8.9 g/dL — ABNORMAL LOW (ref 12.0–15.0)
MCH: 28.4 pg (ref 26.0–34.0)
MCHC: 32.5 g/dL (ref 30.0–36.0)
MCV: 87.5 fL (ref 80.0–100.0)
Platelets: 91 10*3/uL — ABNORMAL LOW (ref 150–400)
RBC: 3.13 MIL/uL — ABNORMAL LOW (ref 3.87–5.11)
RDW: 19.6 % — ABNORMAL HIGH (ref 11.5–15.5)
WBC: 9.7 10*3/uL (ref 4.0–10.5)
nRBC: 0 % (ref 0.0–0.2)

## 2023-01-28 LAB — CULTURE, BETA STREP (GROUP B ONLY): Strep Gp B Culture: POSITIVE — AB

## 2023-01-28 MED ORDER — FERROUS SULFATE 325 (65 FE) MG PO TABS
325.0000 mg | ORAL_TABLET | ORAL | Status: DC
  Administered 2023-01-28: 325 mg via ORAL
  Filled 2023-01-28: qty 1

## 2023-01-28 MED ORDER — FUROSEMIDE 20 MG PO TABS
20.0000 mg | ORAL_TABLET | Freq: Every day | ORAL | Status: DC
  Administered 2023-01-28 – 2023-01-29 (×2): 20 mg via ORAL
  Filled 2023-01-28 (×2): qty 1

## 2023-01-28 NOTE — Progress Notes (Addendum)
Post Partum Day 1 Subjective: Patient is recovering well and has no complaints, up ad lib, voiding, tolerating PO, and + flatus. Patient is breastfeeding. Had Mirena placed in OR.    Objective: Blood pressure 98/68, pulse (!) 56, temperature 97.8 F (36.6 C), temperature source Oral, resp. rate 16, height 5\' 2"  (1.575 m), weight 53.5 kg, last menstrual period 04/28/2022, SpO2 99%, unknown if currently breastfeeding.  S/p RLTCS. Patient has thrombocytopenia which is improving slowly. Hgb was 11.3 s/p IV iron infusion.   Physical Exam:  General: alert, cooperative, and no distress Lochia: appropriate Uterine Fundus: firm Incision: healing well, no significant drainage, no dehiscence, no significant erythema DVT Evaluation: No evidence of DVT seen on physical exam. Lovenox given post-op for DVT prevention.   Recent Labs    01/27/23 1441 01/28/23 0507  HGB 11.3* 8.9*  HCT 35.0* 27.4*    Assessment/Plan: Plan for discharge 01/30/2023 Continue to monitor CBC closely for improvement of anemia and thrombocytopenia. Add iron as needed for anemia.  MMR vaccine ordered.   LOS: 1 day   Hilton Sinclair, Student-PA 01/28/2023, 6:19 AM

## 2023-01-28 NOTE — Anesthesia Postprocedure Evaluation (Signed)
Anesthesia Post Note  Patient: Dorothy Haas  Procedure(s) Performed: CESAREAN SECTION WITH BILATERAL TUBAL LIGATION (Bilateral) INTRAUTERINE DEVICE (IUD) INSERTION (Bilateral)     Patient location during evaluation: PACU Anesthesia Type: Spinal Level of consciousness: oriented and awake and alert Pain management: pain level controlled Vital Signs Assessment: post-procedure vital signs reviewed and stable Respiratory status: spontaneous breathing, respiratory function stable and patient connected to nasal cannula oxygen Cardiovascular status: blood pressure returned to baseline and stable Postop Assessment: no headache, no backache and no apparent nausea or vomiting Anesthetic complications: no   No notable events documented.  Last Vitals:  Vitals:   01/28/23 0054 01/28/23 0545  BP: 113/75 98/68  Pulse: (!) 50 (!) 56  Resp: 16 16  Temp: 36.6 C 36.6 C  SpO2: 100% 99%    Last Pain:  Vitals:   01/28/23 0900  TempSrc:   PainSc: 0-No pain                 [place]

## 2023-01-28 NOTE — Progress Notes (Signed)
This RN called OB Anesthesiologist about removal of epidural catheter with new resulting platelets of 91. OK to pull epidural catheter per Dr. Mal Amabile.

## 2023-01-28 NOTE — Plan of Care (Signed)

## 2023-01-29 ENCOUNTER — Encounter: Payer: Self-pay | Admitting: Obstetrics & Gynecology

## 2023-01-29 ENCOUNTER — Other Ambulatory Visit (HOSPITAL_COMMUNITY): Payer: Self-pay

## 2023-01-29 MED ORDER — FERROUS SULFATE 325 (65 FE) MG PO TABS
325.0000 mg | ORAL_TABLET | ORAL | 0 refills | Status: AC
  Filled 2023-01-29: qty 15, 30d supply, fill #0

## 2023-01-29 MED ORDER — IBUPROFEN 800 MG PO TABS
800.0000 mg | ORAL_TABLET | Freq: Three times a day (TID) | ORAL | 0 refills | Status: DC
  Filled 2023-01-29: qty 30, 10d supply, fill #0

## 2023-01-29 MED ORDER — OXYCODONE HCL 5 MG PO TABS
5.0000 mg | ORAL_TABLET | Freq: Four times a day (QID) | ORAL | 0 refills | Status: AC | PRN
  Filled 2023-01-29: qty 20, 5d supply, fill #0

## 2023-01-29 MED ORDER — DOCUSATE SODIUM 100 MG PO CAPS
100.0000 mg | ORAL_CAPSULE | Freq: Two times a day (BID) | ORAL | 0 refills | Status: AC
  Filled 2023-01-29: qty 40, 20d supply, fill #0

## 2023-01-29 MED ORDER — ACETAMINOPHEN 325 MG PO TABS
650.0000 mg | ORAL_TABLET | Freq: Four times a day (QID) | ORAL | 0 refills | Status: DC | PRN
  Filled 2023-01-29: qty 30, 4d supply, fill #0

## 2023-02-01 ENCOUNTER — Ambulatory Visit: Payer: Medicaid Other | Admitting: Obstetrics and Gynecology

## 2023-02-01 NOTE — Progress Notes (Unsigned)
   GYNECOLOGY OFFICE VISIT NOTE  History:   Dorothy Haas is a 33 y.o. [phone] here today for incision check s/p RLTCS and IUD insertion on 1/16.    She has some cramps in her pelvis when she nurses. Doesn't need any medication for it.    The following portions of the patient's history were reviewed and updated as appropriate: allergies, current medications, past family history, past medical history, past social history, past surgical history and problem list.   Review of Systems:  Pertinent items noted in HPI and remainder of comprehensive ROS otherwise negative.  Physical Exam:  BP 121/84   Pulse 71   Temp 98.2 F (36.8 C)   Wt 106 lb (48.1 kg)   LMP 04/28/2022   Breastfeeding Yes   BMI 19.39 kg/m  CONSTITUTIONAL: Well-developed, well-nourished female in no acute distress.  HEENT:  Normocephalic, atraumatic. External right and left ear normal. No scleral icterus.  NECK: Normal range of motion, supple, no masses noted on observation SKIN: No rash noted. Not diaphoretic. No erythema. No pallor. MUSCULOSKELETAL: Normal range of motion. No edema noted. NEUROLOGIC: Alert and oriented to person, place, and time. Normal muscle tone coordination. No cranial nerve deficit noted. PSYCHIATRIC: Normal mood and affect. Normal behavior. Normal judgment and thought content.  CARDIOVASCULAR: Normal heart rate noted RESPIRATORY: Effort and breath sounds normal, no problems with respiration noted ABDOMEN: No masses noted. No other overt distention noted.  Incision c/d/I   Assessment and Plan:   1. Language barrier (Primary) Arabic interpreter (in person) used throughout.   2. Status post repeat low transverse cesarean section Reviewed uterine window in detail. Reviewed would not advise repeat pregnancy due to risks for her and that pregnancy I.e. uterine rupture.   Incision today is healing well.     No orders of the defined types were placed in this encounter.     Routine preventative health maintenance measures emphasized. Please refer to After Visit Summary for other counseling recommendations.   Return in about 4 weeks (around 03/03/2023) for postpartum visit.  Milas Hock, MD, FACOG Obstetrician & Gynecologist, [facility] for [facility], [facility]

## 2023-02-03 ENCOUNTER — Ambulatory Visit: Payer: Medicaid Other | Admitting: Obstetrics and Gynecology

## 2023-02-03 ENCOUNTER — Encounter: Payer: Self-pay | Admitting: Obstetrics and Gynecology

## 2023-02-03 VITALS — BP 121/84 | HR 71 | Temp 98.2°F | Wt 106.0 lb

## 2023-02-03 DIAGNOSIS — Z98891 History of uterine scar from previous surgery: Secondary | ICD-10-CM | POA: Diagnosis not present

## 2023-02-03 DIAGNOSIS — Z758 Other problems related to medical facilities and other health care: Secondary | ICD-10-CM | POA: Diagnosis not present

## 2023-02-03 DIAGNOSIS — Z603 Acculturation difficulty: Secondary | ICD-10-CM | POA: Diagnosis not present

## 2023-02-08 ENCOUNTER — Encounter: Payer: Medicaid Other | Admitting: Obstetrics and Gynecology

## 2023-03-10 ENCOUNTER — Encounter: Payer: Self-pay | Admitting: Obstetrics and Gynecology

## 2023-03-10 ENCOUNTER — Ambulatory Visit: Payer: Medicaid Other | Admitting: Obstetrics and Gynecology

## 2023-03-10 DIAGNOSIS — N6311 Unspecified lump in the right breast, upper outer quadrant: Secondary | ICD-10-CM

## 2023-03-10 DIAGNOSIS — N6489 Other specified disorders of breast: Secondary | ICD-10-CM | POA: Diagnosis not present

## 2023-03-10 NOTE — Progress Notes (Unsigned)
 Post Partum Visit Note  Dorothy Haas is a 33 y.o. [phone] female who presents for a postpartum visit. She is 6 weeks postpartum following a repeat cesarean section.  I have fully reviewed the prenatal and intrapartum course. The delivery was at 37 gestational weeks.  Anesthesia: epidural. Postpartum course has been unremarkable. Baby is doing well. Baby is feeding by breast. Bleeding staining only. Bowel function is normal. Bladder function is normal. Patient is not sexually active. Contraception method is IUD placed in hospital.Postpartum depression screening: negative- score 1.  Breast lump x 3 weeks. Had something similar in prior pregnancy that went away when she stopped breastfeeding. Has tried warm compresses and continues to breastfeeding without relief.    The pregnancy intention screening data noted above was reviewed. Potential methods of contraception were discussed. The patient elected to proceed with IUD or IUS.   Edinburgh Postnatal Depression Scale - 03/10/23 1014       Edinburgh Postnatal Depression Scale:  In the Past 7 Days   I have been able to laugh and see the funny side of things. 0    I have looked forward with enjoyment to things. 0    I have blamed myself unnecessarily when things went wrong. 0    I have been anxious or worried for no good reason. 0    I have felt scared or panicky for no good reason. 0    Things have been getting on top of me. 1    I have been so unhappy that I have had difficulty sleeping. 0    I have felt sad or miserable. 0    I have been so unhappy that I have been crying. 0    The thought of harming myself has occurred to me. 0    Edinburgh Postnatal Depression Scale Total 1             Health Maintenance Due  Topic Date Due   COVID-19 Vaccine (1) Never done   INFLUENZA VACCINE  08/12/2022   The following portions of the patient's history were reviewed and updated as appropriate: allergies, current medications,  past family history, past medical history, past social history, past surgical history, and problem list.  Review of Systems Pertinent items are noted in HPI.  Objective:  BP 115/81   Pulse 71   Ht 5\' 2"  (1.575 m)   Wt 111 lb (50.3 kg)   LMP 04/28/2022   Breastfeeding Yes   BMI 20.30 kg/m    General:  alert, cooperative, and no distress   Breasts:  3cm nontender well circumscribed round mobile mass in right breast in upper outer quadrant that most likely reflects galactocele  Lungs: Normal effort  Heart:  Regular rate  Abdomen: Soft nontender  Wound Well-healed incision.  Small amount of surgical glue still present, counseled patient to remove  GU exam: IUD strings visualized and appropriate length       Assessment:   Normal postpartum exam IUD in place Breast mass-favor galactocele; will obtain ultrasound to confirm diagnosis given persistence despite using warm compresses and regularly nursing/pumping  Plan:   Essential components of care per ACOG recommendations:  1.  Mood and well being: Patient with negative depression screening today. Reviewed local resources for support.  - Patient tobacco use? No.   - hx of drug use? No.    2. Infant care and feeding:  -Patient currently breastmilk feeding? Yes. Reviewed importance of draining breast regularly to support lactation.  -  Social determinants of health (SDOH) reviewed in EPIC. No concerns  3. Sexuality, contraception and birth spacing - Patient does not want a pregnancy in the next year.  Desired family size is 4 children.  - Reviewed reproductive life planning. Reviewed contraceptive methods based on pt preferences and effectiveness.  Patient desired IUD or IUS today.   - Reviewed recommendation to avoid future pregnancy given large uterine window  4. Sleep and fatigue -Encouraged family/partner/community support of 4 hrs of uninterrupted sleep to help with mood and fatigue  5. Physical Recovery  - Discussed  patients delivery and complications. Reviewed large uterine window as above - Patient had a C-section repeat; no problems after deliver. - Patient has urinary incontinence? No. - Patient is safe to resume physical and sexual activity  6.  Health Maintenance - HM due items addressed No - declines flu - Last pap smear  Diagnosis  Date Value Ref Range Status  03/10/2022   Final   - Negative for intraepithelial lesion or malignancy (NILM)   Pap smear not done at today's visit.  -Breast Cancer screening indicated? No.   7. Chronic Disease/Pregnancy Condition follow up: None - PCP follow up as needed  Lennart Pall, MD [facility] for [employer], [facility]

## 2023-03-11 ENCOUNTER — Encounter: Payer: Self-pay | Admitting: Obstetrics and Gynecology

## 2023-03-14 ENCOUNTER — Other Ambulatory Visit: Payer: Self-pay | Admitting: Obstetrics and Gynecology

## 2023-03-14 DIAGNOSIS — N6311 Unspecified lump in the right breast, upper outer quadrant: Secondary | ICD-10-CM

## 2023-04-05 ENCOUNTER — Encounter: Payer: Self-pay | Admitting: Obstetrics & Gynecology

## 2023-04-13 ENCOUNTER — Ambulatory Visit
Admission: RE | Admit: 2023-04-13 | Discharge: 2023-04-13 | Disposition: A | Discharge status: Home / Self Care | Source: Ambulatory Visit | Attending: Obstetrics and Gynecology | Admitting: Obstetrics and Gynecology

## 2023-04-13 DIAGNOSIS — N6311 Unspecified lump in the right breast, upper outer quadrant: Secondary | ICD-10-CM

## 2023-04-15 ENCOUNTER — Encounter: Payer: Self-pay | Admitting: Obstetrics and Gynecology

## 2024-01-03 ENCOUNTER — Telehealth: Payer: Self-pay

## 2024-01-03 NOTE — Telephone Encounter (Signed)
 RN called patient utilizing Arabic Interpreter through [employer] [phone] to notify dropped off paperwork for immunization record completed. RN notified patient CHERRI form filled out and ready for pick up.  Silvano LELON Piano, RN
